# Patient Record
Sex: Male | Born: 1950 | Race: Black or African American | Hispanic: No | Marital: Single | Smoking: Never smoker
Health system: Southern US, Community
[De-identification: ages and names within clinical notes are randomized; demographics above are authoritative.]

## PROBLEM LIST (undated history)

## (undated) DIAGNOSIS — G510 Bell's palsy: Secondary | ICD-10-CM

## (undated) DIAGNOSIS — F8081 Childhood onset fluency disorder: Secondary | ICD-10-CM

## (undated) DIAGNOSIS — R634 Abnormal weight loss: Principal | ICD-10-CM

## (undated) DIAGNOSIS — E785 Hyperlipidemia, unspecified: Secondary | ICD-10-CM

## (undated) DIAGNOSIS — D573 Sickle-cell trait: Secondary | ICD-10-CM

## (undated) DIAGNOSIS — M1990 Unspecified osteoarthritis, unspecified site: Secondary | ICD-10-CM

## (undated) HISTORY — PX: POLYPECTOMY: SHX149

## (undated) HISTORY — PX: OTHER SURGICAL HISTORY: SHX169

## (undated) HISTORY — DX: Sickle-cell trait: D57.3

## (undated) HISTORY — DX: Unspecified osteoarthritis, unspecified site: M19.90

## (undated) HISTORY — DX: Bell's palsy: G51.0

## (undated) HISTORY — DX: Childhood onset fluency disorder: F80.81

## (undated) HISTORY — DX: Abnormal weight loss: R63.4

## (undated) HISTORY — DX: Hyperlipidemia, unspecified: E78.5

## (undated) HISTORY — PX: TONSILLECTOMY: SUR1361

---

## 2001-09-20 ENCOUNTER — Emergency Department (HOSPITAL_COMMUNITY): Admission: EM | Admit: 2001-09-20 | Discharge: 2001-09-20 | Payer: Self-pay | Admitting: Emergency Medicine

## 2001-09-22 ENCOUNTER — Encounter: Payer: Self-pay | Admitting: Emergency Medicine

## 2001-09-22 ENCOUNTER — Emergency Department (HOSPITAL_COMMUNITY): Admission: EM | Admit: 2001-09-22 | Discharge: 2001-09-22 | Payer: Self-pay | Admitting: Emergency Medicine

## 2001-10-06 ENCOUNTER — Emergency Department (HOSPITAL_COMMUNITY): Admission: EM | Admit: 2001-10-06 | Discharge: 2001-10-06 | Payer: Self-pay | Admitting: Emergency Medicine

## 2013-01-26 ENCOUNTER — Telehealth: Payer: Self-pay

## 2014-07-27 DIAGNOSIS — G510 Bell's palsy: Secondary | ICD-10-CM

## 2014-07-27 HISTORY — DX: Bell's palsy: G51.0

## 2014-08-14 ENCOUNTER — Ambulatory Visit (INDEPENDENT_AMBULATORY_CARE_PROVIDER_SITE_OTHER): Payer: No Typology Code available for payment source | Admitting: Family Medicine

## 2014-08-14 VITALS — BP 139/80 | HR 70 | Temp 97.6°F | Resp 18 | Ht 73.5 in | Wt 178.0 lb

## 2014-08-14 DIAGNOSIS — R29810 Facial weakness: Secondary | ICD-10-CM | POA: Diagnosis not present

## 2014-08-14 DIAGNOSIS — H02103 Unspecified ectropion of right eye, unspecified eyelid: Secondary | ICD-10-CM | POA: Diagnosis not present

## 2014-08-14 DIAGNOSIS — Z131 Encounter for screening for diabetes mellitus: Secondary | ICD-10-CM

## 2014-08-14 DIAGNOSIS — Z13220 Encounter for screening for lipoid disorders: Secondary | ICD-10-CM | POA: Diagnosis not present

## 2014-08-14 DIAGNOSIS — Z125 Encounter for screening for malignant neoplasm of prostate: Secondary | ICD-10-CM | POA: Diagnosis not present

## 2014-08-14 DIAGNOSIS — R21 Rash and other nonspecific skin eruption: Secondary | ICD-10-CM | POA: Diagnosis not present

## 2014-08-14 DIAGNOSIS — Z0000 Encounter for general adult medical examination without abnormal findings: Secondary | ICD-10-CM | POA: Diagnosis not present

## 2014-08-14 DIAGNOSIS — Z23 Encounter for immunization: Secondary | ICD-10-CM

## 2014-08-14 DIAGNOSIS — Z1211 Encounter for screening for malignant neoplasm of colon: Secondary | ICD-10-CM | POA: Diagnosis not present

## 2014-08-14 DIAGNOSIS — Z130 Encounter for screening for diseases of the blood and blood-forming organs and certain disorders involving the immune mechanism: Secondary | ICD-10-CM

## 2014-08-14 DIAGNOSIS — R011 Cardiac murmur, unspecified: Secondary | ICD-10-CM | POA: Diagnosis not present

## 2014-08-14 LAB — CBC
HCT: 43.7 % (ref 39.0–52.0)
Hemoglobin: 14.6 g/dL (ref 13.0–17.0)
MCH: 28.7 pg (ref 26.0–34.0)
MCHC: 33.4 g/dL (ref 30.0–36.0)
MCV: 85.9 fL (ref 78.0–100.0)
MPV: 10.8 fL (ref 8.6–12.4)
Platelets: 272 10*3/uL (ref 150–400)
RBC: 5.09 MIL/uL (ref 4.22–5.81)
RDW: 15.6 % — ABNORMAL HIGH (ref 11.5–15.5)
WBC: 4.1 10*3/uL (ref 4.0–10.5)

## 2014-08-14 LAB — COMPLETE METABOLIC PANEL WITH GFR
ALT: 14 U/L (ref 0–53)
AST: 19 U/L (ref 0–37)
Albumin: 4.3 g/dL (ref 3.5–5.2)
Alkaline Phosphatase: 63 U/L (ref 39–117)
BUN: 11 mg/dL (ref 6–23)
CO2: 26 mEq/L (ref 19–32)
Calcium: 9.6 mg/dL (ref 8.4–10.5)
Chloride: 103 mEq/L (ref 96–112)
Creat: 0.91 mg/dL (ref 0.50–1.35)
GFR, Est African American: >89 mL/min
GFR, Est Non African American: >89 mL/min
Glucose, Bld: 86 mg/dL (ref 70–99)
Potassium: 4.9 mEq/L (ref 3.5–5.3)
Sodium: 139 mEq/L (ref 135–145)
Total Bilirubin: 0.7 mg/dL (ref 0.2–1.2)
Total Protein: 7.1 g/dL (ref 6.0–8.3)

## 2014-08-14 LAB — LIPID PANEL
Cholesterol: 208 mg/dL — ABNORMAL HIGH (ref 0–200)
HDL: 71 mg/dL (ref >39)
LDL Cholesterol: 126 mg/dL — ABNORMAL HIGH (ref 0–99)
Total CHOL/HDL Ratio: 2.9 Ratio
Triglycerides: 53 mg/dL (ref <150)
VLDL: 11 mg/dL (ref 0–40)

## 2014-08-14 MED ORDER — ZOSTER VACCINE LIVE 19400 UNT/0.65ML ~~LOC~~ SOLR: 0.6500 mL | Freq: Once | SUBCUTANEOUS | Status: DC

## 2014-08-14 NOTE — Patient Instructions (Addendum)
For rash - hydrocortisone cream as needed, change form Dial to [employer], and if rash returns - return to have rechecked. Dry skin care: Drink at least 64 ounces of water daily. Consider a humidifier for the room where you sleep. Bathe once daily. Avoid using HOT water, as it dries skin.  Avoid deodorant soaps (Dial is the worst!) and stick with gentle cleansers (I like Cetaphil Liquid Cleanser). After bathing, dry off completely, then apply a thick emollient cream (I like Cetaphil Moisturizing Cream). Apply the cream twice daily, or more!  Shingles vaccine printout at your pharmacy.  We will refer you for colonoscopy.  You should receive a call or letter about your lab results within the next week to 10 days.  We will refer you to eye doctor and neurology for the weakness in the eye.  Can use over the counter refresh ointment at night to keep eye from getting dry for now.  We will also refer you to a heart doctor for evaluation of the heart murmur. Can start aspirin 81mg  once per day for now to lessen stroke or heart attack risk.  Return to the clinic or go to the nearest emergency room if any of your symptoms worsen or new symptoms occur.  Keeping you healthy  Get these tests  Blood pressure- Have your blood pressure checked once a year by your healthcare provider.  Normal blood pressure is 120/80  Weight- Have your body mass index (BMI) calculated to screen for obesity.  BMI is a measure of body fat based on height and weight. You can also calculate your own BMI at ViewBanking.[email or web address].  Cholesterol- Have your cholesterol checked every year.  Diabetes- Have your blood sugar checked regularly if you have high blood pressure, high cholesterol, have a family history of diabetes or if you are overweight.  Screening for Colon Cancer- Colonoscopy starting at age 30.  Screening may begin sooner depending on your family history and other health conditions. Follow up colonoscopy as directed  by your Gastroenterologist.  Screening for Prostate Cancer- Both blood work (PSA) and a rectal exam help screen for Prostate Cancer.  Screening begins at age 29 with African-American men and at age 54 with Caucasian men.  Screening may begin sooner depending on your family history.  Take these medicines  Aspirin- One aspirin daily can help prevent Heart disease and Stroke.  Flu shot- Every fall.  Tetanus- Every 10 years.  Zostavax- Once after the age of 69 to prevent Shingles.  Pneumonia shot- Once after the age of 80; if you are younger than 70, ask your healthcare provider if you need a Pneumonia shot.  Take these steps  Don't smoke- If you do smoke, talk to your doctor about quitting.  For tips on how to quit, go to www.smokefree.gov or call 1-800-QUIT-NOW.  Be physically active- Exercise 5 days a week for at least 30 minutes.  If you are not already physically active start slow and gradually work up to 30 minutes of moderate physical activity.  Examples of moderate activity include walking briskly, mowing the yard, dancing, swimming, bicycling, etc.  Eat a healthy diet- Eat a variety of healthy food such as fruits, vegetables, low fat milk, low fat cheese, yogurt, lean meant, poultry, fish, beans, tofu, etc. For more information go to www.thenutritionsource.org  Drink alcohol in moderation- Limit alcohol intake to less than two drinks a day. Never drink and drive.  Dentist- Brush and floss twice daily; visit your dentist twice a year.  Depression- Your emotional health is as important as your physical health. If you're feeling down, or losing interest in things you would normally enjoy please talk to your healthcare provider.  Eye exam- Visit your eye doctor every year.  Safe sex- If you may be exposed to a sexually transmitted infection, use a condom.  Seat belts- Seat belts can save your life; always wear one.  Smoke/Carbon Monoxide detectors- These detectors need to be  installed on the appropriate level of your home.  Replace batteries at least once a year.  Skin cancer- When out in the sun, cover up and use sunscreen 15 SPF or higher.  Violence- If anyone is threatening you, please tell your healthcare provider.  Living Will/ Health care power of attorney- Speak with your healthcare provider and family.

## 2014-08-14 NOTE — Progress Notes (Addendum)
Subjective:  This chart was scribe for Miguel Forehand, MD by Judithann Sauger, ED Scribe. The patient was seen in Room 12 and the patient's care was started at 11:07 AM.    Patient ID: Miguel Peters, male    DOB: [birth year], 64 y.o.   MRN: [id number]  HPI HPI Comments: Miguel Peters is a 64 y.o. male who presents to the [facility] for a complete physical. No previous [facility] visits in the last few years.  Cancer screening: He denies colonoscopy, No prior prostate cancer screening. He denies any cancer in the immediate family but reports that his aunt had cancer. Prostate cancer screening discussed and pt agrees to have one today. He denies having anything to eat PTA.  Immunizations: Tetanus: a couple of years ago, No Flu vaccine, No shingles vaccine.   Eye care provider: He denies any previous stroke or balls palsy. He also denies any weakness in his face. He reports that he has not been able to fully close his right eye for a couple of years. He denies seeing an Optometrist for this.   Dentist: No dentist. He reports that he does not have dental insurance but has an upcoming appointment with a dentist.  Exercise: He reports that he walks for exercise. He reports chronic pain in his knees and back. He denies any medical problems. He denies and falls in the past year or trouble walking. He denies being told that he has a heart murmur.   Fall and depression screening: Negative per screening tools and CH[facility].   He c/o an intermittent itchy rash on bilateral arms, bilateral legs, and waist onset a couple of months ago. 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 He reports using maximum strength hydrocortisone cream twice a day with improvement but the rash keeps coming back. He reports that he switched over to using Tide Free to wash his clothes after he had a rash and Dial soap to wash his body. He denies any  new lotions. He reports sick contacts at home. He denies any itching in between his fingers.   He lives by himself. He reports occasional knee pain but is able to climb the stairs. He is able to dress by himself. He reports that he is able to hear normally.   There are no active problems to display for this patient.  No past medical history on file. Past Surgical History  Procedure Laterality Date  . Hand surgery      thumb   No Known Allergies Prior to Admission medications   Not on File     Review of Systems  Skin: Positive for rash.   13 point ROS reviewed on pt health survey, negative other than discussed above.     Objective:   Physical Exam  Constitutional: He is oriented to person, place, and time. He appears well-developed and well-nourished. No distress.  HENT:  Head: Normocephalic and atraumatic.  Mouth/Throat: Oropharynx is clear and moist.  Moist mucosa in mouth  Eyes: Conjunctivae and EOM are normal.  Appears to have slight droop over left upper eye.   Neck: Neck supple. No tracheal deviation present.  Cardiovascular: Normal rate.   Murmur heard. 3/6 systolic murmur that does not appear to radiate to his coradins No corradin abruin   Pulmonary/Chest: Effort normal and breath sounds normal. No respiratory distress. He has no wheezes. He has no rales.  Musculoskeletal: Normal range of motion.  Neurological: He is alert and oriented to person, place, and time.  Decreased strength with closing right eye and unable to fully close lower lid on right eye with minimal movement.  Decreased LU facial movement.  Equal mouth movements  Equal strength in upper and lower extremities. No tremors.  No protonator drift, normal heel to toe.   Skin: Skin is warm and dry. No rash noted.  No visible rash at this time.   Psychiatric: He has a normal mood and affect. His behavior is normal.  Nursing note and vitals reviewed.  EKG is sinus rhythm. Rate: 67 No acute findings.    Filed Vitals:   08/14/14 1026  BP: 139/80  Pulse: 70  Temp: 97.6 F (36.4 C)  TempSrc: Oral  Resp: 18  Height: 6' 1.5" (1.867 m)  Weight: 178 lb (80.74 kg)  SpO2: 98%     Visual Acuity Screening   Right eye Left eye Both eyes  Without correction: 20/20 20/20 20/20   With correction:           Assessment & Plan:   Miguel Peters is a 64 y.o. male Annual physical exam  - -anticipatory guidance as below in AVS, screening labs above. Health maintenance items as above in HPI discussed/recommended as applicable.   Screening for diabetes mellitus - Plan: COMPLETE METABOLIC PANEL WITH GFR   Screening for hyperlipidemia - Plan: Lipid panel  Screening, anemia, deficiency, iron - Plan: CBC  Rash and nonspecific skin eruption  -dry skin vs contact derm with soap. Dry skin care as below, change to dove soap, and otc hydrocortisone if needed - rtc if rash returns.   Screen for colon cancer - Plan: Ambulatory referral to Gastroenterology  Screening for prostate cancer - Plan: PSA  -We discussed pros and cons of prostate cancer screening, and after this discussion, he chose to have screening done. PSA obtained, and no concerning findings on DRE.   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 Bells palsy with residual weakness of R lower lid, unable to completely approximate.  Will refer to neuro and optho, refresh ointment QHS to lessen corneal drying, start ASA 81mg  qd. Lipids to risk stratify.   Heart murmur - Plan: EKG 12-Lead, Ambulatory referral to Cardiology  Need for shingles vaccine - Plan: zoster vaccine live, PF, (ZOSTAVAX) 52841 UNT/0.65ML injection paper Rx given.    Meds ordered this encounter  Medications  . zoster vaccine live, PF, (ZOSTAVAX) 32440  UNT/0.65ML injection    Sig: Inject 19,400 Units into the skin once.    Dispense:  1 each    Refill:  0   Patient Instructions  For rash - hydrocortisone cream as needed, change form Dial to [employer], and if rash returns - return to have rechecked. Dry skin care: Drink at least 64 ounces of water daily. Consider a humidifier for the room where you sleep. Bathe once daily. Avoid using HOT water, as it dries skin.  Avoid deodorant soaps (Dial is the worst!) and stick with gentle cleansers (I like Cetaphil Liquid Cleanser). After bathing, dry off completely, then apply a thick emollient cream (I like Cetaphil Moisturizing Cream). Apply the cream twice daily, or more!  Shingles vaccine printout at your pharmacy.  We will refer you  for colonoscopy.  You should receive a call or letter about your lab results within the next week to 10 days.  We will refer you to eye doctor and neurology for the weakness in the eye.  Can use over the counter refresh ointment at night to keep eye from getting dry for now.  We will also refer you to a heart doctor for evaluation of the heart murmur. Can start aspirin 81mg  once per day for now to lessen stroke or heart attack risk.  Return to the clinic or go to the nearest emergency room if any of your symptoms worsen or new symptoms occur.  Keeping you healthy  Get these tests  Blood pressure- Have your blood pressure checked once a year by your healthcare provider.  Normal blood pressure is 120/80  Weight- Have your body mass index (BMI) calculated to screen for obesity.  BMI is a measure of body fat based on height and weight. You can also calculate your own BMI at ViewBanking.si.  Cholesterol- Have your cholesterol checked every year.  Diabetes- Have your blood sugar checked regularly if you have high blood pressure, high cholesterol, have a family history of diabetes or if you are overweight.  Screening for Colon Cancer- Colonoscopy starting  at age 44.  Screening may begin sooner depending on your family history and other health conditions. Follow up colonoscopy as directed by your Gastroenterologist.  Screening for Prostate Cancer- Both blood work (PSA) and a rectal exam help screen for Prostate Cancer.  Screening begins at age 81 with African-American men and at age 64 with Caucasian men.  Screening may begin sooner depending on your family history.  Take these medicines  Aspirin- One aspirin daily can help prevent Heart disease and Stroke.  Flu shot- Every fall.  Tetanus- Every 10 years.  Zostavax- Once after the age of 40 to prevent Shingles.  Pneumonia shot- Once after the age of 49; if you are younger than 30, ask your healthcare provider if you need a Pneumonia shot.  Take these steps  Don't smoke- If you do smoke, talk to your doctor about quitting.  For tips on how to quit, go to www.smokefree.gov or call 1-800-QUIT-NOW.  Be physically active- Exercise 5 days a week for at least 30 minutes.  If you are not already physically active start slow and gradually work up to 30 minutes of moderate physical activity.  Examples of moderate activity include walking briskly, mowing the yard, dancing, swimming, bicycling, etc.  Eat a healthy diet- Eat a variety of healthy food such as fruits, vegetables, low fat milk, low fat cheese, yogurt, lean meant, poultry, fish, beans, tofu, etc. For more information go to www.thenutritionsource.org  Drink alcohol in moderation- Limit alcohol intake to less than two drinks a day. Never drink and drive.  Dentist- Brush and floss twice daily; visit your dentist twice a year.  Depression- Your emotional health is as important as your physical health. If you're feeling down, or losing interest in things you would normally enjoy please talk to your healthcare provider.  Eye exam- Visit your eye doctor every year.  Safe sex- If you may be exposed to a sexually transmitted infection, use a  condom.  Seat belts- Seat belts can save your life; always wear one.  Smoke/Carbon Monoxide detectors- These detectors need to be installed on the appropriate level of your home.  Replace batteries at least once a year.  Skin cancer- When out in the sun, cover up and use  sunscreen 15 SPF or higher.  Violence- If anyone is threatening you, please tell your healthcare provider.  Living Will/ Health care power of attorney- Speak with your healthcare provider and family.    I personally performed the services described in this documentation, which was scribed in my presence. The recorded information has been reviewed and considered, and addended by me as needed.

## 2014-08-15 LAB — PSA: PSA: 1.53 ng/mL (ref <=4.00)

## 2014-08-21 ENCOUNTER — Encounter: Payer: Self-pay | Admitting: Internal Medicine

## 2014-08-22 ENCOUNTER — Encounter: Payer: Self-pay | Admitting: Family Medicine

## 2014-09-11 ENCOUNTER — Encounter: Payer: Self-pay | Admitting: Neurology

## 2014-09-11 ENCOUNTER — Ambulatory Visit (INDEPENDENT_AMBULATORY_CARE_PROVIDER_SITE_OTHER): Payer: No Typology Code available for payment source | Admitting: Neurology

## 2014-09-11 VITALS — BP 126/78 | HR 70 | Ht 73.5 in | Wt 179.0 lb

## 2014-09-11 DIAGNOSIS — G510 Bell's palsy: Secondary | ICD-10-CM

## 2014-09-11 MED ORDER — LUBRICANT EYE OP OINT: 1.0000 [drp] | TOPICAL_OINTMENT | OPHTHALMIC | Status: DC | PRN

## 2014-09-11 NOTE — Progress Notes (Signed)
PATIENT: Miguel Peters DOB: July 08, 1951  HISTORICAL  Miguel Peters is a 64 years old right-handed male, referred by his primary care physician Dr. Nyoka Cowden, accompanied by his cousin Buren Kos for evaluation of difficulty closing his right eye  He had 12 years of education, work as housekeeping, has started drinking speech,become unemployed 2 years ago, currently lives in a boarding house,he was noticed to have difficulty closing his right eye,in his yearly checkup in January 2016  He has not seen a physician for few years, he was not sure the onset time of his difficulty closing his right eye, over the past few years, he noticed he could not close his right eye while sleeping, watery of his right eye, but denied visual loss, no hearing loss, no significant change over the past few years,  He denies gait difficulty, he never drives in his life, was not able to afford a car  REVIEW OF SYSTEMS: Full 14 system review of systems performed and notable only for joint pain, slurred speech  ALLERGIES: No Known Allergies  HOME MEDICATIONS: Current Outpatient Prescriptions  Medication Sig Dispense Refill  . aspirin 81 MG tablet Take 81 mg by mouth daily.     No current facility-administered medications for this visit.    PAST MEDICAL HISTORY: Past Medical History  Diagnosis Date  . Arthritis     PAST SURGICAL HISTORY: Past Surgical History  Procedure Laterality Date  . Hand surgery      thumb  . Tonsillectomy      FAMILY HISTORY: Family History  Problem Relation Age of Onset  . Sickle cell anemia Mother   . Kidney disease Father     SOCIAL HISTORY:  History   Social History  . Marital Status: Single    Spouse Name: N/A  . Number of Children: 0  . Years of Education: 12   Occupational History  . [employer]   Social History Main Topics  . Smoking status: Never Smoker   . Smokeless tobacco: Not on file  . Alcohol  Use: 0.0 oz/week    0 Standard drinks or equivalent per week  . Drug Use: No  . Sexual Activity: Not on file   Other Topics Concern  . Not on file   Social History Narrative   Lives at a boarding house.   Right handed.   2-4 cups/caffeine daily     PHYSICAL EXAM   Filed Vitals:   09/11/14 0855  BP: 126/78  Pulse: 70  Height: 6' 1.5" (1.867 m)  Weight: 179 lb (81.194 kg)    Not recorded      Body mass index is 23.29 kg/(m^2).   Generalized: In no acute distress  Neck: Supple, no carotid bruits   Cardiac: Regular rate rhythm  Pulmonary: Clear to auscultation bilaterally  Musculoskeletal: No deformity  Neurological examination  Mentation: Alert oriented to time, place, history taking, and causual conversation  Cranial nerve II-XII: Pupils were equal round reactive to light. Extraocular movements were full.  Visual field were full on confrontational test. Bilateral fundi were sharp.  Facial sensation was normal. he has difficulty closing his right eye, was able to cover his right cornea only.bilateral corneal reflexes were present. Hearing was intact to finger rubbing bilaterally. Uvula tongue midline.  Head turning and shoulder shrug and were normal and symmetric.Tongue protrusion into cheek strength was normal.  Motor: Normal tone, bulk and strength.  Sensory: Intact to fine touch, pinprick, preserved vibratory sensation, and  proprioception at toes.  Coordination: Normal finger to nose, heel-to-shin bilaterally there was no truncal ataxia  Gait: Rising up from seated position without assistance, normal stance, without trunk ataxia, moderate stride, good arm swing, smooth turning, able to perform tiptoe, and heel walking without difficulty.   Romberg signs: Negative  Deep tendon reflexes: Brachioradialis 2/2, biceps 2/2, triceps 2/2, patellar 2/2, Achilles 2/2, plantar responses were flexor bilaterally.   DIAGNOSTIC DATA (LABS, IMAGING, TESTING) - I reviewed  patient records, labs, notes, testing and imaging myself where available.  Lab Results  Component Value Date   WBC 4.1 08/14/2014   HGB 14.6 08/14/2014   HCT 43.7 08/14/2014   MCV 85.9 08/14/2014   PLT 272 08/14/2014      Component Value Date/Time   NA 139 08/14/2014 1144   K 4.9 08/14/2014 1144   CL 103 08/14/2014 1144   CO2 26 08/14/2014 1144   GLUCOSE 86 08/14/2014 1144   BUN 11 08/14/2014 1144   CREATININE 0.91 08/14/2014 1144   CALCIUM 9.6 08/14/2014 1144   PROT 7.1 08/14/2014 1144   ALBUMIN 4.3 08/14/2014 1144   AST 19 08/14/2014 1144   ALT 14 08/14/2014 1144   ALKPHOS 63 08/14/2014 1144   BILITOT 0.7 08/14/2014 1144   GFRNONAA >89 08/14/2014 1144   GFRAA >89 08/14/2014 1144   Lab Results  Component Value Date   CHOL 208* 08/14/2014   HDL 71 08/14/2014   LDLCALC 126* 08/14/2014   TRIG 53 08/14/2014   CHOLHDL 2.9 08/14/2014   ASSESSMENT AND PLAN  Miguel Peters is a 64 y.o. 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 Return to clinic for new issues  Marcial Pacas, M.D. Ph.D.  [facility] [address], [place], [place] [address] Ph: [phone] Fax: [phone]

## 2014-09-11 NOTE — Patient Instructions (Signed)
Miguel Peters, M.D. Associate Professor, Ophthalmology [facility]) Clinical Interests Cataract Surgery, Comprehensive Ophthalmology, Diabetic Eye Disease, Eyelid Reconstruction, [place] Patient Appointments: [phone]  Returning Patient Appointments: [phone] Department: [phone]

## 2014-09-19 ENCOUNTER — Ambulatory Visit (AMBULATORY_SURGERY_CENTER): Payer: Self-pay | Admitting: *Deleted

## 2014-09-19 VITALS — Ht 75.0 in | Wt 185.0 lb

## 2014-09-19 DIAGNOSIS — Z1211 Encounter for screening for malignant neoplasm of colon: Secondary | ICD-10-CM

## 2014-09-19 MED ORDER — NA SULFATE-K SULFATE-MG SULF 17.5-3.13-1.6 GM/177ML PO SOLN: ORAL | Status: DC

## 2014-09-19 NOTE — Progress Notes (Signed)
Patient denies any allergies to eggs or soy. Patient denies any problems with anesthesia/sedation. Patient denies any oxygen use at home and does not take any diet/weight loss medications. No computer per pt.

## 2014-10-05 ENCOUNTER — Ambulatory Visit (AMBULATORY_SURGERY_CENTER): Payer: No Typology Code available for payment source | Admitting: Internal Medicine

## 2014-10-05 ENCOUNTER — Encounter: Payer: Self-pay | Admitting: Internal Medicine

## 2014-10-05 VITALS — BP 121/76 | HR 56 | Temp 96.2°F | Resp 16 | Ht 73.0 in | Wt 179.0 lb

## 2014-10-05 DIAGNOSIS — Z1211 Encounter for screening for malignant neoplasm of colon: Secondary | ICD-10-CM

## 2014-10-05 DIAGNOSIS — D124 Benign neoplasm of descending colon: Secondary | ICD-10-CM | POA: Diagnosis not present

## 2014-10-05 DIAGNOSIS — D122 Benign neoplasm of ascending colon: Secondary | ICD-10-CM

## 2014-10-05 HISTORY — PX: COLONOSCOPY: SHX174

## 2014-10-05 MED ORDER — SODIUM CHLORIDE 0.9 % IV SOLN: 500.0000 mL | INTRAVENOUS | Status: DC

## 2014-10-05 NOTE — Progress Notes (Signed)
Called to room to assist during endoscopic procedure.  Patient ID and intended procedure confirmed with present staff. Received instructions for my participation in the procedure from the performing physician.  

## 2014-10-05 NOTE — Progress Notes (Signed)
Ok to discharge patient in 20 minutes if stable per Dr. Olevia Perches.

## 2014-10-05 NOTE — Progress Notes (Signed)
To recovery. Report to Nyra Capes, Therapist, sports. VSS

## 2014-10-05 NOTE — Op Note (Signed)
[facility]  [address]. [place], [address]   COLONOSCOPY PROCEDURE REPORT  PATIENT: Miguel, Peters  MR#: [id number] BIRTHDATE: 23-May-1951 , 12  yrs. old GENDER: male ENDOSCOPIST: Lafayette Dragon, MD REFERRED SF:KCLEXNT Greene, M.D. PROCEDURE DATE:  10/05/2014 PROCEDURE:   Colonoscopy, screening, Colonoscopy with snare polypectomy, and Colonoscopy with cold biopsy polypectomy First Screening Colonoscopy - Avg.  risk and is 50 yrs.  old or older Yes.  Prior Negative Screening - Now for repeat screening. N/A  History of Adenoma - Now for follow-up colonoscopy & has been > or = to 3 yrs.  N/A ASA CLASS:   Class II INDICATIONS:Screening for colonic neoplasia and Colorectal Neoplasm Risk Assessment for this procedure is average risk. MEDICATIONS: Monitored anesthesia care and Propofol 300 mg IV  DESCRIPTION OF PROCEDURE:   After the risks benefits and alternatives of the procedure were thoroughly explained, informed consent was obtained.  The digital rectal exam revealed no abnormalities of the rectum.   The LB [id number]  endoscope was introduced through the anus and advanced to the cecum, which was identified by both the appendix and ileocecal valve. No adverse events experienced.   The quality of the prep was good.  (MoviPrep was used)  The instrument was then slowly withdrawn as the colon was fully examined.      COLON FINDINGS: Three sessile polyps measuring 11 mm, 8 mm and 3 mm in size were found in the descending colonx1 and ascending colon.x2 ( 3 mm and 11 mm)  A polypectomy was performed with a cold snare. of 11 mm and 8 mm polyps  The resection was complete, the polyp tissue was completely retrieved and sent to histology.  A polypectomy was performed with cold forceps.of 3 mm polyp  The resection was complete, the polyp tissue was completely retrieved and sent to histology.   There was mild diverticulosis noted in the sigmoid colon.   Small  internal Grade I hemorrhoids were found. Retroflexed views revealed no abnormalities. The time to cecum = 5.29 Withdrawal time = 10.55   The scope was withdrawn and the procedure completed. COMPLICATIONS: There were no immediate complications.  ENDOSCOPIC IMPRESSION: 1.   Three sessile polyps were found in the descendingx1  colon and ascending colon;x2  polypectomy was performed with a cold snare; polypectomy was performed with cold forceps 2.   Mild diverticulosis was noted in the sigmoid colon 3.   Small internal Grade I hemorrhoids  RECOMMENDATIONS: Await path report No aspirin or anti-inflammatory agents for 2 weeks High fiber diet Recall colonoscopy pending path report  eSigned:  Lafayette Dragon, MD 10/05/2014 9:23 AM   cc:   PATIENT NAME:  Miguel, Peters MR#: [id number]

## 2014-10-05 NOTE — Patient Instructions (Signed)
YOU HAD AN ENDOSCOPIC PROCEDURE TODAY AT [facility]:   Refer to the procedure report that was given to you for any specific questions about what was found during the examination.  If the procedure report does not answer your questions, please call your gastroenterologist to clarify.  If you requested that your care partner not be given the details of your procedure findings, then the procedure report has been included in a sealed envelope for you to review at your convenience later.  YOU SHOULD EXPECT: Some feelings of bloating in the abdomen. Passage of more gas than usual.  Walking can help get rid of the air that was put into your GI tract during the procedure and reduce the bloating. If you had a lower endoscopy (such as a colonoscopy or flexible sigmoidoscopy) you may notice spotting of blood in your stool or on the toilet paper. If you underwent a bowel prep for your procedure, you may not have a normal bowel movement for a few days.  Please Note:  You might notice some irritation and congestion in your nose or some drainage.  This is from the oxygen used during your procedure.  There is no need for concern and it should clear up in a day or so.  SYMPTOMS TO REPORT IMMEDIATELY:   Following lower endoscopy (colonoscopy or flexible sigmoidoscopy):  Excessive amounts of blood in the stool  Significant tenderness or worsening of abdominal pains  Swelling of the abdomen that is new, acute  Fever of 100F or higher   For urgent or emergent issues, a gastroenterologist can be reached at any hour by calling [phone].   DIET: Your first meal following the procedure should be a small meal and then it is ok to progress to your normal diet. Heavy or fried foods are harder to digest and may make you feel nauseous or bloated.  Likewise, meals heavy in dairy and vegetables can increase bloating.  Drink plenty of fluids but you should avoid alcoholic beverages for 24 hours. Increase  the fiber in your diet.  ACTIVITY:  You should plan to take it easy for the rest of today and you should NOT DRIVE or use heavy machinery until tomorrow (because of the sedation medicines used during the test).    FOLLOW UP: Our staff will call the number listed on your records the next business day following your procedure to check on you and address any questions or concerns that you may have regarding the information given to you following your procedure. If we do not reach you, we will leave a message.  However, if you are feeling well and you are not experiencing any problems, there is no need to return our call.  We will assume that you have returned to your regular daily activities without incident.  If any biopsies were taken you will be contacted by phone or by letter within the next 1-3 weeks.  Please call us at [phone] if you have not heard about the biopsies in 3 weeks.    SIGNATURES/CONFIDENTIALITY: You and/or your care partner have signed paperwork which will be entered into your electronic medical record.  These signatures attest to the fact that that the information above on your After Visit Summary has been reviewed and is understood.  Full responsibility of the confidentiality of this discharge information lies with you and/or your care-partner.  NO ASPIRIN NOR NSAIDS FOR TWO WEEKS PER DR. Olevia Perches.  Read all handouts given to you by  your recovery room nurse.

## 2014-10-08 ENCOUNTER — Telehealth: Payer: Self-pay | Admitting: *Deleted

## 2014-10-08 NOTE — Telephone Encounter (Signed)
Left message that we called for f/u 

## 2014-10-09 ENCOUNTER — Encounter: Payer: Self-pay | Admitting: Internal Medicine

## 2016-01-06 ENCOUNTER — Telehealth: Payer: Self-pay

## 2016-01-06 NOTE — Telephone Encounter (Signed)
Pt called saying Dr. Carlota Raspberry had gave him some cream before for a rash.  He has no insurance and cant afford a visit.  I told him that it was very unlikely for someone to call him in something without seeing him.  Please be advised the patient has a stutter.  [phone]

## 2016-01-07 NOTE — Telephone Encounter (Signed)
Pt advised to RTC for Rx.

## 2017-01-08 ENCOUNTER — Ambulatory Visit (INDEPENDENT_AMBULATORY_CARE_PROVIDER_SITE_OTHER): Payer: Self-pay | Admitting: Internal Medicine

## 2017-01-08 ENCOUNTER — Encounter: Payer: Self-pay | Admitting: Internal Medicine

## 2017-01-08 VITALS — BP 110/78 | HR 70 | Resp 16 | Ht 74.0 in | Wt 145.0 lb

## 2017-01-08 DIAGNOSIS — F8081 Childhood onset fluency disorder: Secondary | ICD-10-CM

## 2017-01-08 DIAGNOSIS — R634 Abnormal weight loss: Secondary | ICD-10-CM

## 2017-01-08 DIAGNOSIS — Z5941 Food insecurity: Secondary | ICD-10-CM

## 2017-01-08 DIAGNOSIS — Z594 Lack of adequate food and safe drinking water: Secondary | ICD-10-CM

## 2017-01-08 HISTORY — DX: Childhood onset fluency disorder: F80.81

## 2017-01-08 NOTE — Progress Notes (Signed)
Subjective:    Patient ID: Miguel Peters, male    DOB: 24-May-1951, 66 y.o.   MRN: [id number]  HPI   Here to establish.  1.  Losing weight:  Not eating regularly.  Has noted weight loss for 2 months.  Has problems moving bowels at times--but sounds like his stool is soft, he just does not take enough in to have regular bowel movements.   Depends on what he eats and his food access. No loss of appetite or abdominal pain.  No melena or hematochezia.  Did have a colonoscopy in 2016, which apparently showed adenomatous polyps, but was otherwise normal--Dr. Olevia Perches No SOB, chest pain, cough, no definite fatigue, but lies down a lot for naps.   Lives by himself in apartment.   Does not belong to a church.  Does know where there is a food pantry by where he lives on [address] He has lost 32 lbs in the last 2 years.   Runs out of Money now as his EBT was cut drastically.  Does have social security.   PMH: 1.  Notable for right lower eyelid ectropion repair, tarsal strip and  Tarsorrhaphy.   This performed in 2016 at [facility].    2.  Chronic stuttering:  States it started when moved to [place] when 66 years old.  He was told it was due to the change in environment.  Current Meds  Medication Sig  . Artificial Tear Ointment (LUBRICANT EYE) OINT Apply 1 drop to eye as needed.  Marland Kitchen aspirin 81 MG tablet Take 81 mg by mouth daily.    No Known Allergies   Past Medical History:  Diagnosis Date  . Arthritis   . Stutter 01/08/2017    Past Surgical History:  Procedure Laterality Date  . hand surgery     thumb  . TONSILLECTOMY      Family History  Problem Relation Age of Onset  . Sickle cell anemia Mother   . Kidney disease Father   . Alcohol abuse Father   . Colon cancer Neg Hx     Social History   Social History  . Marital status: Single    Spouse name: N/A  . Number of children: 0  . Years of education: 12   Occupational History  . [employer]    2016   Social History Main Topics  . Smoking status: Never Smoker  . Smokeless tobacco: Never Used  . Alcohol use 0.0 oz/week     Comment: occasional per pt.  . Drug use: No  . Sexual activity: Not on file   Other Topics Concern  . Not on file   Social History Narrative   Lives at a boarding house.   Patient having difficulty affording food--reportedly because he lives in boarding house and does not have utilitiy bills, his EBT dollars were cut to $13 to $15 per month.   Sister states he also does not have room really to store his food.         Review of Systems     Objective:   Physical Exam  Tall and thin HEENT: right eye with decreased blinking.  Temporal canthus of right eye closed tightly with lower lid showing small degree of ectropion.  PERRL, EOMI, TMs pearly gray, throat clear, MMM Neck: Supple, No adenopathy Chest:  CTA CV:  RRR with normal S1 and S2, No S3, S4 or murmur, No carotid bruits, Carotid, radial pulses normal and equal Abd:  S, NT, No HSM or mass, + BS,  rubbing abdomen throughout interview. LE:  No edema       Assessment & Plan:  1.  Housing issues:  Sister states cannot get EBT amount increased (currently at $15 per month) until pays and [employer]chand[employer], retail, which won't happen in a boarding house.  He also is not allowed to store much food in his room:  Referral to [facility] for Homeless prevention as his weight loss is significant with inability to pay for food with current living situation.  2.  Food Insecurity with weight loss.  Check TSH, CBC, CBC to be certain

## 2017-01-08 NOTE — Patient Instructions (Signed)
[place]. For low cost food.

## 2017-01-09 LAB — TSH: TSH: 3.36 u[IU]/mL (ref 0.450–4.500)

## 2017-01-09 LAB — CBC WITH DIFFERENTIAL/PLATELET
BASOS: 2 %
Basophils Absolute: 0.1 10*3/uL (ref 0.0–0.2)
EOS (ABSOLUTE): 0.3 10*3/uL (ref 0.0–0.4)
EOS: 9 %
HEMATOCRIT: 41.3 % (ref 37.5–51.0)
HEMOGLOBIN: 14.2 g/dL (ref 13.0–17.7)
Immature Grans (Abs): 0 10*3/uL (ref 0.0–0.1)
Immature Granulocytes: 0 %
LYMPHS ABS: 1.6 10*3/uL (ref 0.7–3.1)
Lymphs: 42 %
MCH: 29 pg (ref 26.6–33.0)
MCHC: 34.4 g/dL (ref 31.5–35.7)
MCV: 84 fL (ref 79–97)
MONOCYTES: 6 %
Monocytes Absolute: 0.2 10*3/uL (ref 0.1–0.9)
Neutrophils Absolute: 1.6 10*3/uL (ref 1.4–7.0)
Neutrophils: 41 %
Platelets: 216 10*3/uL (ref 150–379)
RBC: 4.9 x10E6/uL (ref 4.14–5.80)
RDW: 15.4 % (ref 12.3–15.4)
WBC: 3.9 10*3/uL (ref 3.4–10.8)

## 2017-01-09 LAB — COMPREHENSIVE METABOLIC PANEL
ALBUMIN: 4.5 g/dL (ref 3.6–4.8)
ALK PHOS: 54 IU/L (ref 39–117)
ALT: 16 IU/L (ref 0–44)
AST: 20 IU/L (ref 0–40)
Albumin/Globulin Ratio: 2 (ref 1.2–2.2)
BUN / CREAT RATIO: 18 (ref 10–24)
BUN: 19 mg/dL (ref 8–27)
Bilirubin Total: 0.6 mg/dL (ref 0.0–1.2)
CALCIUM: 9.8 mg/dL (ref 8.6–10.2)
CO2: 26 mmol/L (ref 20–29)
CREATININE: 1.04 mg/dL (ref 0.76–1.27)
Chloride: 97 mmol/L (ref 96–106)
GFR, EST AFRICAN AMERICAN: 87 mL/min/{1.73_m2} (ref >59)
GFR, EST NON AFRICAN AMERICAN: 76 mL/min/{1.73_m2} (ref >59)
GLUCOSE: 72 mg/dL (ref 65–99)
Globulin, Total: 2.2 g/dL (ref 1.5–4.5)
Potassium: 4.6 mmol/L (ref 3.5–5.2)
Sodium: 139 mmol/L (ref 134–144)
TOTAL PROTEIN: 6.7 g/dL (ref 6.0–8.5)

## 2017-01-11 ENCOUNTER — Encounter: Payer: Self-pay | Admitting: Internal Medicine

## 2017-02-12 ENCOUNTER — Encounter: Payer: Self-pay | Admitting: Internal Medicine

## 2017-02-12 ENCOUNTER — Ambulatory Visit (INDEPENDENT_AMBULATORY_CARE_PROVIDER_SITE_OTHER): Payer: Self-pay | Admitting: Internal Medicine

## 2017-02-12 VITALS — BP 122/70 | HR 74 | Resp 12 | Ht 74.0 in | Wt 142.0 lb

## 2017-02-12 DIAGNOSIS — Z9189 Other specified personal risk factors, not elsewhere classified: Secondary | ICD-10-CM

## 2017-02-12 DIAGNOSIS — R634 Abnormal weight loss: Secondary | ICD-10-CM

## 2017-02-12 HISTORY — DX: Abnormal weight loss: R63.4

## 2017-02-12 NOTE — Progress Notes (Signed)
   Subjective:    Patient ID: Miguel Peters, male    DOB: 10-07-1950, 66 y.o.   MRN: [id number]  HPI   1.  Weight loss due to poor food access as storage as living in a rooming house.  Sister brought him today.   Not clear if he screens his his telephone calls.  He states, however, he has not heard from [facility] regarding looking for a better home where he might have food storage.   He is in need of food today. He is only eating 2 meals daily and would prefer eating 3.  Generally, does not have breakfast. Has had spaghetti out of a can today.  Discussed his labs were all okay from June, though cholesterol mildly elevated.  Current Meds  Medication Sig  . Artificial Tear Ointment (LUBRICANT EYE) OINT Apply 1 drop to eye as needed.  Marland Kitchen aspirin 81 MG tablet Take 81 mg by mouth daily.    No Known Allergies     Review of Systems     Objective:   Physical Exam  NAD Lungs:  CTA CV:  RRR without murmur or rub, radial and DP pulses normal and equal Abd:  S, NT, No HSM or mass, + BS LE:  No edema.        Assessment & Plan:  1.  Weight loss, presumably from lack of food access. SHIIP  Spoke with Rev. Coates from [facility] on [address] Saturday.  Spoke with patient subsequently, who agreed it was okay to given her his address and phone to contact for support.  2.  Need for dental care:  Dental referral. Discussed we dropped off his referral to [facility] on Friday of last week.Marland Kitchen

## 2017-03-19 ENCOUNTER — Ambulatory Visit (INDEPENDENT_AMBULATORY_CARE_PROVIDER_SITE_OTHER): Payer: Self-pay | Admitting: Internal Medicine

## 2017-03-19 ENCOUNTER — Encounter: Payer: Self-pay | Admitting: Internal Medicine

## 2017-03-19 VITALS — BP 122/80 | HR 72 | Resp 12 | Ht 74.0 in | Wt 154.0 lb

## 2017-03-19 DIAGNOSIS — R634 Abnormal weight loss: Secondary | ICD-10-CM

## 2017-03-19 NOTE — Progress Notes (Signed)
   Subjective:    Patient ID: Miguel Peters, male    DOB: 1951/02/12, 66 y.o.   MRN: [id number]  HPI   Through HUD, he has obtained an apartment in [employer] apts on [address], which is apparently senior housing.  He will have his own apartment with kitchen.  Will move in 1.5 weeks.   He was contacted by [facility], not clear if they were the group helping him with HUD, likely not based on his description. He also received food from [facility]--very difficult to get a history if this is continuing.   Sounds like he was to return to the church if he needed more food and he has not done that.   Patient has gained 12 lbs since last visit due to access to more food.  Current Meds  Medication Sig  . Artificial Tear Ointment (LUBRICANT EYE) OINT Apply 1 drop to eye as needed.  Marland Kitchen aspirin 81 MG tablet Take 81 mg by mouth daily.   No Known Allergies Review of Systems     Objective:   Physical Exam  NAD LUngs:  CTA CV:   RRR without murmur or rub, radial and DP pulses normal and equal LE:  No edema         Assessment & Plan:  Good weight gain with improved food access. Will have him return for a full CPE.   He will get Medicare end of January and will need to find a new medical home as he does not live in the area.

## 2017-06-02 NOTE — Telephone Encounter (Signed)
done

## 2017-06-22 ENCOUNTER — Ambulatory Visit: Payer: Self-pay | Admitting: Internal Medicine

## 2017-06-22 ENCOUNTER — Encounter: Payer: Self-pay | Admitting: Internal Medicine

## 2017-06-22 VITALS — BP 130/88 | HR 78 | Resp 12 | Ht 74.0 in | Wt 161.0 lb

## 2017-06-22 DIAGNOSIS — Z23 Encounter for immunization: Secondary | ICD-10-CM

## 2017-06-22 DIAGNOSIS — G510 Bell's palsy: Secondary | ICD-10-CM

## 2017-06-22 DIAGNOSIS — R634 Abnormal weight loss: Secondary | ICD-10-CM

## 2017-06-22 DIAGNOSIS — F8081 Childhood onset fluency disorder: Secondary | ICD-10-CM

## 2017-06-22 DIAGNOSIS — Z0000 Encounter for general adult medical examination without abnormal findings: Secondary | ICD-10-CM

## 2017-06-22 DIAGNOSIS — Z125 Encounter for screening for malignant neoplasm of prostate: Secondary | ICD-10-CM

## 2017-06-22 DIAGNOSIS — E7800 Pure hypercholesterolemia, unspecified: Secondary | ICD-10-CM

## 2017-06-22 NOTE — Progress Notes (Signed)
Subjective:    Patient ID: Miguel Peters, male    DOB: 19-May-1951, 66 y.o.   MRN: [id number]  HPI   Here for CPE  Has a couple of concerns--jammed and cut his right little finger and also a rash on his back on and off for past 2 years.    Here for Male CPE:  1.  STE:  Does perform.  No findings.  No family history of testicular cancer.  2.  PSA/DRE: Last PSA was 08/14/2014 with normal result of 1.53.  He believes he has had DRE, which has been normal in the past.  No family history of prostate cancer.  4.  Colonoscopy: Only time performed 10/05/3014 with Dr. Delfin Edis.  Had 2 tubular adenomas without dysplasia.  She recommended repeat in 2021.    5.  Cholesterol/Glucose:  History of mildly elevated total cholesterol with very high HDL. Lipid Panel     Component Value Date/Time   CHOL 208 (H) 08/14/2014 1144   TRIG 53 08/14/2014 1144   HDL 71 08/14/2014 1144   CHOLHDL 2.9 08/14/2014 1144   VLDL 11 08/14/2014 1144   LDLCALC 126 (H) 08/14/2014 1144   Blood glucose has been normal in 2016 and 2018.  6.  Immunizations: Refuses influenza.  Cannot remember last Td.    7.  Osteoporosis:  Does not eat or drink or eat much in way of diary except for cheese.  He would be willing to drink 2 % Lactaid or almond milk o3 times daily.  Drinks 2 L of Coke weekly.  Caffeine free.     Review of Systems  Constitutional: Negative for appetite change and fatigue.  HENT: Positive for dental problem (His dental appointment keeps getting cancelled.  Has been once this year.  States his orange card is out of date. ). Negative for ear pain, hearing loss, sinus pressure, sneezing, sore throat and trouble swallowing.   Eyes:       States sees okay without correction.  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  Respiratory: Negative for cough and shortness of breath.   Cardiovascular: Negative for chest pain, palpitations and leg swelling.  Gastrointestinal: Positive for constipation  (sometimes--improves diet and this resolved). Negative for abdominal pain, blood in stool (no melena), diarrhea and nausea.  Genitourinary: Negative for decreased urine volume, dysuria, frequency, genital sores and testicular pain.  Musculoskeletal: Positive for arthralgias (Has known arthritis).  Skin: Positive for rash (on and off on back for past 2 years.).  Neurological: Negative for dizziness, weakness, numbness and headaches.  Hematological: Negative for adenopathy. Does not bruise/bleed easily.  Psychiatric/Behavioral: Negative for dysphoric mood. The patient is not nervous/anxious.        Objective:   Physical Exam  Constitutional: He is oriented to person, place, and time. He appears well-developed and well-nourished.  HENT:  Head: Normocephalic and atraumatic.  Right Ear: Hearing and external ear normal.  Left Ear: Hearing and external ear normal.  Nose: Nose normal.  Mouth/Throat: Uvula is midline, oropharynx is clear and moist and mucous membranes are normal. Normal dentition (many missing teeth.  some cavities).  Has cerumen obscuring most of TM bilaterally.  Eyes: Conjunctivae and EOM are normal. Pupils are equal, round, and reactive to light.  Discs sharp bilaterally Unable to fully close right eye.  Neck: Normal range of motion and full passive range of motion without pain. Neck supple. No thyroid mass and no thyromegaly present.  Cardiovascular: Normal rate, regular rhythm, S1 normal,  S2 normal and normal heart sounds. Exam reveals no S3, no S4 and no friction rub.  No murmur heard. No carotid bruits.  Carotid, radial, femoral, DP and PT pulses normal and equal.   Pulmonary/Chest: Effort normal and breath sounds normal.  Abdominal: Soft. Bowel sounds are normal. He exhibits no mass. There is no hepatosplenomegaly. There is no tenderness. No hernia. Hernia confirmed negative in the right inguinal area and confirmed negative in the left inguinal area.  Genitourinary:  Rectum normal and prostate normal. Rectal exam shows guaiac negative stool. Right testis shows no mass, no swelling and no tenderness. Right testis is descended. Left testis shows no mass, no swelling and no tenderness. Left testis is descended. Uncircumcised.  Musculoskeletal: Normal range of motion.  Right 5th finger with base of nail involved with subungual hematoma and mild swelling at base of nail.   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  Lymphadenopathy:       Head (right side): No submental and no submandibular adenopathy present.       Head (left side): No submental and no submandibular adenopathy present.    He has no cervical adenopathy.    He has no axillary adenopathy.       Right: No inguinal and no supraclavicular adenopathy present.       Left: No inguinal and no supraclavicular adenopathy present.  Neurological: He is alert and oriented to person, place, and time. He has normal strength and normal reflexes. He displays normal reflexes. A cranial nerve deficit (right upper face with weakness, inability to completely close eyelids.  Lower face with normal strength.) is present. No sensory deficit. He exhibits normal muscle tone. Coordination and gait normal.  Skin: Skin is warm and dry. No rash (Does have scattered hyperpigmentation over lumbosacral back and  around waistiline, but no current rash.) noted.  Psychiatric: His behavior is normal. Judgment and thought content normal. Cognition and memory are normal.  Speech with stutter.  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          Assessment & Plan:  1.  CPE CBC, CMP earlier this year normal PSA and FLP with history of elevated cholesterol with high HDL in 2016 Normal ECG in 2016 and no concerning cardiac complaints. Weight now  improving now he has improved access to food and food storage. Stool cards to be returned in 2 weeks. Refuses Influenza vaccine despite counseling.  Unable to clarify why he does not want. Tdap today  2.  History of adenomatous colon polyps:  Colonoscopy repeat in 2021.  3.  Dental decay:  Needs to renew orange card so can get dental care before he obtains Medicare in Feb.  2019.  Will see if he can get back here to get that done.

## 2017-06-22 NOTE — Patient Instructions (Signed)
Can google "advance directives, [place]"  And bring up form from Secretary of State. Print and fill out Or can go to "5 wishes"  Which is also in Spanish and fill out--this costs $5--perhaps easier to use. Designate a Medical Power of Attorney to speak for you if you are unable to speak for yourself when ill or injured  

## 2017-06-23 LAB — LIPID PANEL W/O CHOL/HDL RATIO
CHOLESTEROL TOTAL: 269 mg/dL — AB (ref 100–199)
HDL: 75 mg/dL (ref >39)
LDL CALC: 180 mg/dL — AB (ref 0–99)
Triglycerides: 71 mg/dL (ref 0–149)
VLDL CHOLESTEROL CAL: 14 mg/dL (ref 5–40)

## 2017-06-23 LAB — PSA: Prostate Specific Ag, Serum: 0.6 ng/mL (ref 0.0–4.0)

## 2017-07-08 ENCOUNTER — Telehealth: Payer: Self-pay | Admitting: Internal Medicine

## 2017-07-08 NOTE — Telephone Encounter (Signed)
Patient stated rash is still on back wants an Rx to see if rash will clear up.

## 2017-07-09 NOTE — Telephone Encounter (Signed)
To Dr. Mulberry for further direction 

## 2017-07-12 NOTE — Telephone Encounter (Signed)
He needs to be seen

## 2017-07-13 ENCOUNTER — Other Ambulatory Visit (INDEPENDENT_AMBULATORY_CARE_PROVIDER_SITE_OTHER): Payer: Self-pay

## 2017-07-13 DIAGNOSIS — Z1211 Encounter for screening for malignant neoplasm of colon: Secondary | ICD-10-CM

## 2017-07-13 LAB — POC HEMOCCULT BLD/STL (HOME/3-CARD/SCREEN)
Card #3 Fecal Occult Blood, POC: NEGATIVE
FECAL OCCULT BLD: NEGATIVE
Fecal Occult Blood, POC: NEGATIVE

## 2017-07-13 NOTE — Telephone Encounter (Signed)
Please schedule acute appointment for patient

## 2017-08-27 ENCOUNTER — Ambulatory Visit: Payer: No Typology Code available for payment source | Admitting: Nurse Practitioner

## 2017-08-30 ENCOUNTER — Ambulatory Visit (INDEPENDENT_AMBULATORY_CARE_PROVIDER_SITE_OTHER): Payer: Medicare HMO | Admitting: Family Medicine

## 2017-08-30 ENCOUNTER — Encounter: Payer: Self-pay | Admitting: Family Medicine

## 2017-08-30 VITALS — BP 118/80 | HR 61 | Temp 98.1°F | Resp 12 | Ht 74.0 in | Wt 184.1 lb

## 2017-08-30 DIAGNOSIS — G8929 Other chronic pain: Secondary | ICD-10-CM | POA: Diagnosis not present

## 2017-08-30 DIAGNOSIS — Z23 Encounter for immunization: Secondary | ICD-10-CM

## 2017-08-30 DIAGNOSIS — L810 Postinflammatory hyperpigmentation: Secondary | ICD-10-CM | POA: Diagnosis not present

## 2017-08-30 DIAGNOSIS — M545 Low back pain, unspecified: Secondary | ICD-10-CM

## 2017-08-30 DIAGNOSIS — E785 Hyperlipidemia, unspecified: Secondary | ICD-10-CM

## 2017-08-30 NOTE — Patient Instructions (Addendum)
A few things to remember from today's visit:   Post-inflammatory hyperpigmentation  Hyperlipidemia, unspecified hyperlipidemia type  Chronic low back pain without sciatica, unspecified back pain laterality  Fall precautions. Time for pigmented areas to resolve.  We will check cholesterol in 12/2017.  Icy hot path,stretching exercsies,and Tylenol 500 mg max 3 times per day may help.    Back pain is very common in adults.The cause of back pain is rarely dangerous and the pain often gets better over time even with no pharmacologic treatment.  The cause of your back pain may not be known. Some common causes of back pain include: 1. Strain of the muscles or ligaments supporting the spine. 2. Wear and tear (degeneration) of the spinal disks. 3. Arthritis. 4. Direct injury to the back.  For many people, back pain may return. Since back pain is rarely dangerous, most people can learn to manage this condition on their own.  HOME CARE INSTRUCTIONS Watch your back pain for any changes. The following actions may help to lessen any discomfort you are feeling:  1. Remain active. It is stressful on your back to sit or stand in one place for long periods of time. Do not sit, drive, or stand in one place for more than 30 minutes at a time. Take short walks on even surfaces as soon as you are able.Try to increase the length of time you walk each day.  2. Exercise regularly as directed by your health care provider. Exercise helps your back heal faster. It also helps avoid future injury by keeping your muscles strong and flexible.  3. Do not stay in bed.Resting more than 1-2 days can delay your recovery.                                                      4. Pay attention to your body when you bend and lift. The most comfortable positions are those that put less stress on your recovering back.  5.  Always use proper lifting techniques, including: Bending your knees. Keeping the load close  to your body. Avoiding twisting.  6. Find a comfortable position to sleep. Use a firm mattress and lie on your side with your knees slightly bent. If you lie on your back, put a pillow under your knees.  7. Over the counter rubbing medications like Icy Hot or Asper cream with Lidocaine may help without significant side effects.  Acetaminophen and/or Aleve/Ibuprofen can be taken if needed and if not contraindications. Local ice and heat may be alternated to reduce pain and spasms. Also massage and even chiropractor treatment.      Muscle relaxants might or might not help, they cause drowsiness among other    side effects. They could also interact with some of medications you may be already taking (medications for depression/anxiety and some pain medications).   8. Maintain a healthy weight. Excess weight puts extra stress on your back and makes it difficult to maintain good posture.   SEEK MEDICAL CARE IF: worsening pain, associated fever, rash/edema on area, pain going to legs or buttocks, numbness/tingling, night pain, or abnormal weight loss.    SEEK IMMEDIATE MEDICAL CARE IF:  1. You develop new bowel or bladder control problems. 2. You have unusual weakness or numbness in your arms or legs. 3. You develop nausea or  vomiting. 4. You develop abdominal pain. 5. You feel faint.     Back Exercises The following exercises strengthen the muscles that help to support the back. They also help to keep the lower back flexible. Doing these exercises can help to prevent back pain or lessen existing pain. If you have back pain or discomfort, try doing these exercises 2-3 times each day or as told by your health care provider. When the pain goes away, do them once each day, but increase the number of times that you repeat the steps for each exercise (do more repetitions). If you do not have back pain or discomfort, do these exercises once each day or as told by your health care  provider.   EXERCISES Single Knee to Chest Repeat these steps 3-5 times for each leg: 5. Lie on your back on a firm bed or the floor with your legs extended. 6. Bring one knee to your chest. Your other leg should stay extended and in contact with the floor. 7. Hold your knee in place by grabbing your knee or thigh. 8. Pull on your knee until you feel a gentle stretch in your lower back. 9. Hold the stretch for 10-30 seconds. 10. Slowly release and straighten your leg.  Pelvic Tilt Repeat these steps 5-10 times: 2. Lie on your back on a firm bed or the floor with your legs extended. 3. Bend your knees so they are pointing toward the ceiling and your feet are flat on the floor. 4. Tighten your lower abdominal muscles to press your lower back against the floor. This motion will tilt your pelvis so your tailbone points up toward the ceiling instead of pointing to your feet or the floor. 5. With gentle tension and even breathing, hold this position for 5-10 seconds.  Cat-Cow Repeat these steps until your lower back becomes more flexible: 1. Get into a hands-and-knees position on a firm surface. Keep your hands under your shoulders, and keep your knees under your hips. You may place padding under your knees for comfort. 2. Let your head hang down, and point your tailbone toward the floor so your lower back becomes rounded like the back of a cat. 3. Hold this position for 5 seconds. 4. Slowly lift your head and point your tailbone up toward the ceiling so your back forms a sagging arch like the back of a cow. 5. Hold this position for 5 seconds.   Press-Ups Repeat these steps 5-10 times: 6. Lie on your abdomen (face-down) on the floor. 7. Place your palms near your head, about shoulder-width apart. 8. While you keep your back as relaxed as possible and keep your hips on the floor, slowly straighten your arms to raise the top half of your body and lift your shoulders. Do not use your back  muscles to raise your upper torso. You may adjust the placement of your hands to make yourself more comfortable. 9. Hold this position for 5 seconds while you keep your back relaxed. 10. Slowly return to lying flat on the floor.   Bridges Repeat these steps 10 times: 1. Lie on your back on a firm surface. 2. Bend your knees so they are pointing toward the ceiling and your feet are flat on the floor. 3. Tighten your buttocks muscles and lift your buttocks off of the floor until your waist is at almost the same height as your knees. You should feel the muscles working in your buttocks and the back of your thighs.  If you do not feel these muscles, slide your feet 1-2 inches farther away from your buttocks. 4. Hold this position for 3-5 seconds. 5. Slowly lower your hips to the starting position, and allow your buttocks muscles to relax completely. If this exercise is too easy, try doing it with your arms crossed over your chest.     Please be sure medication list is accurate. If a new problem present, please set up appointment sooner than planned today.

## 2017-08-30 NOTE — Progress Notes (Signed)
HPI:   Miguel Peters is a 67 y.o. male, who is here today to establish care with me.  Former PCP: Dr Amil Amen Last preventive routine visit: 05/2016  Chronic medical problems: HLD,right Bell's palsy with residual weakness in 2016.  He does not drive.   Concerns today:   Rash on lower back that he has had intermittently for 2 years. Topical mediation OTC helps temporarily,he doe snot recall name.  Occasionally it turns red. He thinks it is related to new detergent. Rash resolved but "dark spots" are still present on area.  He denies tenderness,numbness,or tingling.    Hyperlipidemia:  Currently on non pharmacologic treatment. Following a low fat diet: Yes.    Lab Results  Component Value Date   CHOL 269 (H) 06/22/2017   HDL 75 06/22/2017   LDLCALC 180 (H) 06/22/2017   TRIG 71 06/22/2017   CHOLHDL 2.9 08/14/2014     He is also c/o lower back pain for "some time", left lower back mainly. Exacerbated by exercise, when he does set ups. Pain is not radiated,no limitation of his daily activities. He denies saddle anesthesia,changes in urine/bowel continence,or LE tingling/numbness.  He has not tried OTC medication.  Review of Systems  Constitutional: Negative for activity change, appetite change, fatigue and fever.  HENT: Negative for nosebleeds, sore throat and trouble swallowing.   Eyes: Negative for redness and visual disturbance.  Respiratory: Negative for cough, shortness of breath and wheezing.   Cardiovascular: Negative for chest pain, palpitations and leg swelling.  Gastrointestinal: Negative for abdominal pain, nausea and vomiting.  Endocrine: Negative for cold intolerance, heat intolerance, polydipsia, polyphagia and polyuria.  Genitourinary: Negative for decreased urine volume, dysuria and hematuria.  Musculoskeletal: Positive for back pain and gait problem.  Skin: Positive for rash. Negative for wound.  Neurological: Negative for  syncope, weakness and headaches.  Psychiatric/Behavioral: Negative for confusion. The patient is nervous/anxious.       Current Outpatient Medications on File Prior to Visit  Medication Sig Dispense Refill  . aspirin 81 MG tablet Take 81 mg by mouth daily.     No current facility-administered medications on file prior to visit.      Past Medical History:  Diagnosis Date  . Arthritis   . Bell's palsy 2016   Right sided.  Not clear when acutely suffered this.  Was noted on exam after years of no medical attention 07/2014.  Evaluated by [facility]  subsequently.  Marland Kitchen Hyperlipidemia   . Stutter age 34   cannot recall if something happened in life that set this off.  . Weight loss 02/12/2017   No Known Allergies  Family History  Problem Relation Age of Onset  . Sickle cell anemia Mother   . Kidney disease Father   . Alcohol abuse Father   . Colon cancer Neg Hx     Social History   Socioeconomic History  . Marital status: Single    Spouse name: None  . Number of children: 0  . Years of education: 95  . Highest education level: 12th grade  Social Needs  . Financial resource strain: Hard  . Food insecurity - worry: Sometimes true  . Food insecurity - inability: Sometimes true  . Transportation needs - medical: No  . Transportation needs - non-medical: No  Occupational History  . Occupation: Maintenance/Housekeeping    Employer: [employer]    Comment: 2016  Tobacco Use  . Smoking status: Never Smoker  .  Smokeless tobacco: Never Used  Substance and Sexual Activity  . Alcohol use: Yes    Alcohol/week: 0.0 oz    Comment: Once a monthly  . Drug use: No  . Sexual activity: Yes    Partners: Female  Other Topics Concern  . None  Social History Narrative   Was living at a boarding house.   Patient was having difficulty affording food--reportedly because he lived in boarding house and did not have utilitiy bills, his EBT dollars were cut to $13 to  $15 per month.   That has been changed.  He now receives adequate EBT dollars.      Now living at [facility].  This is for folks with a disability.  He has an apartment now with a kitchen.  The [place] near him set him up in these apartments that they support.     Grant Ruts [phone]    Vitals:   08/30/17 0849  BP: 118/80  Pulse: 61  Resp: 12  Temp: 98.1 F (36.7 C)  SpO2: 98%    Body mass index is 23.64 kg/m.    Physical Exam  Nursing note and vitals reviewed. Constitutional: He is oriented to person, place, and time. He appears well-developed and well-nourished. No distress.  HENT:  Head: Normocephalic and atraumatic.  Mouth/Throat: Oropharynx is clear and moist and mucous membranes are normal.  Eyes: Conjunctivae are normal. Pupils are equal, round, and reactive to light.  Cardiovascular: Normal rate and regular rhythm.  No murmur heard. Pulses:      Dorsalis pedis pulses are 2+ on the right side, and 2+ on the left side.  Respiratory: Effort normal and breath sounds normal. No respiratory distress.  GI: Soft. He exhibits no mass. There is no hepatomegaly. There is no tenderness.  Musculoskeletal: He exhibits no edema.       Lumbar back: He exhibits no tenderness and no bony tenderness.  Lymphadenopathy:    He has no cervical adenopathy.  Neurological: He is alert and oriented to person, place, and time. He has normal strength.  Right facial weakness. Mildly unstable gait,not assisted.  Skin: Skin is warm. No rash noted. No erythema.  Hyperpigmented macular confluent lesions on mid sacral area. No active rash appreciated,no edema and no tenderness.  Psychiatric: He has a normal mood and affect. Cognition and memory are normal.  Stuttering a few times. Well groomed, good eye contact.     ASSESSMENT AND PLAN:   [name] was seen today for establish care.  Diagnoses and all orders for this visit:  Hyperlipidemia, unspecified hyperlipidemia  type  For now continue low fat diet. Will repeat lab in 12/2017.  Post-inflammatory hyperpigmentation  Reassured. Educated about problem and explained that it may persist for months or years if rash does not re-occur and no sun exposure. F/U as needed.  Chronic low back pain without sciatica, unspecified back pain laterality  Recommend avoiding trigger factors. Topical Icy hot or similar OTC product may help. Instructed about warning signs.  Need for vaccination against Streptococcus pneumoniae using pneumococcal conjugate vaccine 13 -     Pneumococcal conjugate vaccine 13-valent IM      Betty G. Martinique, MD  [facility]. [place] office.

## 2017-08-30 NOTE — Assessment & Plan Note (Signed)
Recommend avoiding activities that cause pain. If needed Tylenol 500 mg tid. Icy hot patch may also help as well as stretching exercises. Fall prevention.

## 2017-08-30 NOTE — Assessment & Plan Note (Signed)
Continue low fat diet. We will re-check in 12/2017.

## 2017-09-03 ENCOUNTER — Telehealth: Payer: Self-pay | Admitting: Family Medicine

## 2017-09-03 NOTE — Telephone Encounter (Signed)
Form giving to Dr. Martinique, will call patient when form is completed.

## 2017-09-03 NOTE — Telephone Encounter (Signed)
SCAT application to be filled out- placed in dr's folder.  Call [phone] upon completion.

## 2017-09-10 DIAGNOSIS — Z0279 Encounter for issue of other medical certificate: Secondary | ICD-10-CM

## 2017-09-13 NOTE — Telephone Encounter (Signed)
Patient picked up form and paid $29 form fee  Copy sent to scan

## 2017-11-08 ENCOUNTER — Other Ambulatory Visit: Payer: Self-pay

## 2017-12-28 ENCOUNTER — Other Ambulatory Visit: Payer: Self-pay | Admitting: *Deleted

## 2017-12-28 ENCOUNTER — Encounter: Payer: Self-pay | Admitting: Family Medicine

## 2017-12-28 ENCOUNTER — Ambulatory Visit (INDEPENDENT_AMBULATORY_CARE_PROVIDER_SITE_OTHER): Payer: Medicare Other | Admitting: Family Medicine

## 2017-12-28 VITALS — BP 122/80 | HR 80 | Temp 97.6°F | Resp 16 | Ht 74.0 in | Wt 185.5 lb

## 2017-12-28 DIAGNOSIS — H6121 Impacted cerumen, right ear: Secondary | ICD-10-CM | POA: Diagnosis not present

## 2017-12-28 DIAGNOSIS — L298 Other pruritus: Secondary | ICD-10-CM

## 2017-12-28 DIAGNOSIS — L2989 Other pruritus: Secondary | ICD-10-CM

## 2017-12-28 DIAGNOSIS — E785 Hyperlipidemia, unspecified: Secondary | ICD-10-CM | POA: Diagnosis not present

## 2017-12-28 LAB — LIPID PANEL
CHOL/HDL RATIO: 4
Cholesterol: 226 mg/dL — ABNORMAL HIGH (ref 0–200)
HDL: 54.1 mg/dL (ref >39.00)
LDL CALC: 163 mg/dL — AB (ref 0–99)
NonHDL: 171.65
Triglycerides: 44 mg/dL (ref 0.0–149.0)
VLDL: 8.8 mg/dL (ref 0.0–40.0)

## 2017-12-28 MED ORDER — TRIAMCINOLONE ACETONIDE 0.1 % EX CREA: 1.0000 "application " | TOPICAL_CREAM | Freq: Two times a day (BID) | CUTANEOUS | 2 refills | Status: DC | PRN

## 2017-12-28 MED ORDER — TRIAMCINOLONE ACETONIDE 0.1 % EX CREA
1.0000 | TOPICAL_CREAM | Freq: Two times a day (BID) | CUTANEOUS | 2 refills | Status: DC | PRN
Start: 2017-12-28 — End: 2017-12-28

## 2017-12-28 NOTE — Progress Notes (Signed)
HPI:   [name] is a 67 y.o. male, who is here today with some concerns.  C/O  pruritic and erythematous rash on left-sided of abdomen for 2 years intermittently. He uses OTC Hydrocortisone and it helps.  He tells me that around same time rash started he changed detergent. He went back to old one and rash improved. Still has milder episodes of "red and itchy" non vesicular rash.  Hyperlipidemia:  Currently on non pharmacologic treatment. 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  Lab Results  Component Value Date   CHOL 269 (H) 06/22/2017   HDL 75 06/22/2017   LDLCALC 180 (H) 06/22/2017   TRIG 71 06/22/2017   CHOLHDL 2.9 08/14/2014   He is also requesting a lavage. He has had intermittent hearing loss right ear, mild.  In the past it has been caused by cerumen impaction, he has seen ENT to "clean" his aches. No earache or drainage.   Review of Systems  Constitutional: Negative for activity change, appetite change, fatigue and fever.  HENT: Positive for hearing loss. Negative for ear discharge, ear pain, nosebleeds and sore throat.   Respiratory: Negative for apnea, cough, shortness of breath and wheezing.   Cardiovascular: Negative for chest pain, palpitations and leg swelling.  Gastrointestinal: Negative for abdominal pain, nausea and vomiting.  Genitourinary: Negative for decreased urine volume and hematuria.  Skin: Positive for rash. Negative for wound.  Neurological: Negative for syncope, weakness and headaches.      Current Outpatient Medications on File Prior to Visit  Medication Sig Dispense Refill  . aspirin 81 MG tablet Take 81 mg by mouth daily.     No current facility-administered medications on file prior to visit.      Past Medical History:  Diagnosis Date  . Arthritis   . Bell's palsy 2016   Right sided.  Not clear when acutely suffered this.  Was noted on exam after  years of no medical attention 07/2014.  Evaluated by [facility]  subsequently.  Marland Kitchen Hyperlipidemia   . Stutter age 25   cannot recall if something happened in life that set this off.  . Weight loss 02/12/2017   No Known Allergies  Social History   Socioeconomic History  . Marital status: Single    Spouse name: Not on file  . Number of children: 0  . Years of education: 76  . Highest education level: 12th grade  Occupational History  . Occupation: Maintenance/Housekeeping    Employer: [employer]    Comment: 2016  Social Needs  . Financial resource strain: Hard  . Food insecurity:    Worry: Sometimes true    Inability: Sometimes true  . Transportation needs:    Medical: No    Non-medical: No  Tobacco Use  . Smoking status: Never Smoker  . Smokeless tobacco: Never Used  Substance and Sexual Activity  . Alcohol use: Yes    Alcohol/week: 0.0 oz    Comment: Once a monthly  . Drug use: No  . Sexual activity: Yes    Partners: Female  Lifestyle  . Physical activity:    Days per week: 7 days    Minutes per session: 60 min  . Stress: Only a little  Relationships  . Social connections:    Talks on phone: Twice a week    Gets together: Once a week    Attends  religious service: Never    Active member of club or organization: No    Attends meetings of clubs or organizations: Never    Relationship status: Never married  Other Topics Concern  . Not on file  Social History Narrative   Was living at a boarding house.   Patient was having difficulty affording food--reportedly because he lived in boarding house and did not have utilitiy bills, his EBT dollars were cut to $13 to $15 per month.   That has been changed.  He now receives adequate EBT dollars.      Now living at [facility].  This is for folks with a disability.  He has an apartment now with a kitchen.  The [place] near him set him up in these apartments that they support.     Grant Ruts  [phone]    Vitals:   12/28/17 0839  BP: 122/80  Pulse: 80  Resp: 16  Temp: 97.6 F (36.4 C)  SpO2: 97%   Body mass index is 23.82 kg/m.  Wt Readings from Last 3 Encounters:  12/28/17 185 lb 8 oz (84.1 kg)  08/30/17 184 lb 2 oz (83.5 kg)  06/22/17 161 lb (73 kg)     Physical Exam  Nursing note and vitals reviewed. Constitutional: He is oriented to person, place, and time. He appears well-developed. No distress.  HENT:  Head: Normocephalic and atraumatic.  Left Ear: Tympanic membrane, external ear and ear canal normal.  Mouth/Throat: Oropharynx is clear and moist and mucous membranes are normal.  Right ear canal with cerumen excess, I cannot see TM.  Eyes: Conjunctivae are normal.  Cardiovascular: Normal rate and regular rhythm.  No murmur heard. Pulses:      Dorsalis pedis pulses are 2+ on the right side, and 2+ on the left side.  Respiratory: Effort normal and breath sounds normal. No respiratory distress.  GI: Soft. He exhibits no mass. There is no tenderness.  Musculoskeletal: He exhibits no edema.  Lymphadenopathy:    He has no cervical adenopathy.  Neurological: He is alert and oriented to person, place, and time.  I do not appreciate focal deficit except for right facial palsy. Mildly unstable gait with no assistance.  Skin: Skin is warm. No rash noted. No erythema.  Mild hyperpigmented macular lesions on LLQ aspect of abdomen. No erythematous rash appreciated.  Psychiatric: He has a normal mood and affect. Cognition and memory are normal.  Well groomed, good eye contact.    ASSESSMENT AND PLAN:  Miguel Peters was seen today for follow-up.  Orders Placed This Encounter  Procedures  . Lipid panel   Lab Results  Component Value Date   CHOL 226 (H) 12/28/2017   HDL 54.10 12/28/2017   LDLCALC 163 (H) 12/28/2017   TRIG 44.0 12/28/2017   CHOLHDL 4 12/28/2017     Hyperlipidemia, unspecified hyperlipidemia type  Continue low fat diet. Further  recommendations will be given according to FLP results and 10 years ASCVD score.  The 10-year ASCVD risk score Mikey Bussing DC Brooke Bonito., et al., 2013) is: 9.2%   Values used to calculate the score:     Age: 25 years     Sex: Male     Is Non-Hispanic African American: Yes     Diabetic: No     Tobacco smoker: No     Systolic Blood Pressure: 277 mmHg     Is BP treated: No     HDL Cholesterol: 54.1 mg/dL     Total Cholesterol:  226 mg/dL  -     Lipid panel  Chronic pruritic rash in adult  Currently he has no rash. ? Eczema. Topical steroid to use as needed and for up to 14 days at the time.  -     Triamcinolone cream (KENALOG) 0.1 %; Apply 1 application topically 2 (two) times daily as needed.  Hearing loss due to cerumen impaction, right  Improved after ear lavage. Still some cerumen in ear canal, TM seen partially,no erythema. Recommend no Q tip use and OTC Debrox.     Amiyrah Lamere G. Martinique, MD  [facility] [place] office.

## 2017-12-28 NOTE — Patient Instructions (Addendum)
A few things to remember from today's visit:   Hyperlipidemia, unspecified hyperlipidemia type - Plan: Lipid panel  Chronic pruritic rash in adult - Plan: triamcinolone cream (KENALOG) 0.1 %  Hearing loss due to cerumen impaction, right  Debrox ear drops to remove rest of wax in right ear.   Please be sure medication list is accurate. If a new problem present, please set up appointment sooner than planned today.

## 2018-01-05 ENCOUNTER — Other Ambulatory Visit: Payer: Self-pay | Admitting: *Deleted

## 2018-01-05 MED ORDER — SIMVASTATIN 20 MG PO TABS: 20.0000 mg | ORAL_TABLET | Freq: Every day | ORAL | 3 refills | Status: DC

## 2018-02-21 ENCOUNTER — Encounter: Payer: Self-pay | Admitting: Family Medicine

## 2018-02-21 ENCOUNTER — Ambulatory Visit (INDEPENDENT_AMBULATORY_CARE_PROVIDER_SITE_OTHER): Payer: Medicare Other | Admitting: Family Medicine

## 2018-02-21 DIAGNOSIS — L298 Other pruritus: Secondary | ICD-10-CM | POA: Diagnosis not present

## 2018-02-21 MED ORDER — TRIAMCINOLONE ACETONIDE 0.1 % EX CREA: 1.0000 "application " | TOPICAL_CREAM | Freq: Two times a day (BID) | CUTANEOUS | 2 refills | Status: DC | PRN

## 2018-02-21 NOTE — Progress Notes (Signed)
ACUTE VISIT   HPI:  Chief Complaint  Patient presents with  . Rash    left side groin area and on left side of abdomen, started a few days ago, redness and irritation    Mr.Keir J Binion is a 67 y.o. male, who is here today complaining of pruritic erythematosus rash that started a couple days ago. He has had similar rash intermittently for years. Topical steroid cream seems to help "little."   Negative for new medication, detergent, soap, or body product. No known insect bite or outdoor exposures to plants. No sick contact.   He denies associated oral lesions/edema,cough, wheezing, dyspnea, abdominal pain, nausea, or vomiting.  According to patient, he has never been evaluated by dermatologist for this condition.  He has not identified exacerbating factors.  Problem seems to be stable.   Review of Systems  Constitutional: Negative for appetite change, fatigue and fever.  HENT: Negative for mouth sores and sore throat.   Respiratory: Negative for cough, shortness of breath and wheezing.   Gastrointestinal: Negative for abdominal pain, nausea and vomiting.  Genitourinary: Negative for decreased urine volume and hematuria.  Musculoskeletal: Negative for joint swelling and myalgias.  Skin: Positive for rash. Negative for wound.  Hematological: Negative for adenopathy. Does not bruise/bleed easily.      Current Outpatient Medications on File Prior to Visit  Medication Sig Dispense Refill  . aspirin 81 MG tablet Take 81 mg by mouth daily.    . simvastatin (ZOCOR) 20 MG tablet Take 1 tablet (20 mg total) by mouth at bedtime. 90 tablet 3   No current facility-administered medications on file prior to visit.      Past Medical History:  Diagnosis Date  . Arthritis   . Bell's palsy 2016   Right sided.  Not clear when acutely suffered this.  Was noted on exam after years of no medical attention 07/2014.  Evaluated by [facility]  subsequently.  Marland Kitchen  Hyperlipidemia   . Stutter age 67   cannot recall if something happened in life that set this off.  . Weight loss 02/12/2017   No Known Allergies  Social History   Socioeconomic History  . Marital status: Single    Spouse name: Not on file  . Number of children: 0  . Years of education: 56  . Highest education level: 12th grade  Occupational History  . Occupation: Maintenance/Housekeeping    Employer: [employer]    Comment: 2016  Social Needs  . Financial resource strain: Hard  . Food insecurity:    Worry: Sometimes true    Inability: Sometimes true  . Transportation needs:    Medical: No    Non-medical: No  Tobacco Use  . Smoking status: Never Smoker  . Smokeless tobacco: Never Used  Substance and Sexual Activity  . Alcohol use: Yes    Alcohol/week: 0.0 oz    Comment: Once a monthly  . Drug use: No  . Sexual activity: Yes    Partners: Female  Lifestyle  . Physical activity:    Days per week: 7 days    Minutes per session: 60 min  . Stress: Only a little  Relationships  . Social connections:    Talks on phone: Twice a week    Gets together: Once a week    Attends religious service: Never    Active member of club or organization: No    Attends meetings of clubs or organizations: Never  Relationship status: Never married  Other Topics Concern  . Not on file  Social History Narrative   Was living at a boarding house.   Patient was having difficulty affording food--reportedly because he lived in boarding house and did not have utilitiy bills, his EBT dollars were cut to $13 to $15 per month.   That has been changed.  He now receives adequate EBT dollars.      Now living at [facility].  This is for folks with a disability.  He has an apartment now with a kitchen.  The [place] near him set him up in these apartments that they support.     Grant Ruts [phone]    Vitals:   02/21/18 1100  BP: 118/78  Pulse: 66  Resp: 12  Temp: 97.7 F  (36.5 C)  SpO2: 97%   Body mass index is 23.95 kg/m.   Physical Exam  Nursing note and vitals reviewed. Constitutional: He appears well-developed. No distress.  HENT:  Head: Atraumatic.  Mouth/Throat: Oropharynx is clear and moist and mucous membranes are normal.  Eyes: Conjunctivae are normal.  Neck: No tracheal deviation present.  Respiratory: Effort normal and breath sounds normal. No respiratory distress.  Musculoskeletal: He exhibits no edema.  Lymphadenopathy:    He has no cervical adenopathy.  Neurological: He has normal strength.  Unstable gait with no assistance.  Skin: Skin is warm. Rash noted. Rash is maculopapular. Rash is not vesicular. No erythema.     Erythematous, confluent rash on left inner thigh, waist left-sided, and left gluteal. There is no clear center. Rash is not tender upon palpation.  Psychiatric: His mood appears anxious.  Well groomed, good eye contact.      ASSESSMENT AND PLAN:   Mr.Shamir was seen today for rash.  Diagnoses and all orders for this visit:  Chronic pruritic rash in adult -     triamcinolone cream (KENALOG) 0.1 %; Apply 1 application topically 2 (two) times daily as needed. -     Ambulatory referral to Dermatology   We discussed possible etiologies,?  Eczema, medication reaction. Topical triamcinolone twice daily for up to 14 days recommended. Because problem has been chronic/recurrent, he would like dermatology evaluation.    Return if symptoms worsen or fail to improve.      Betty G. Martinique, MD  [facility]. [place] office.

## 2018-02-21 NOTE — Patient Instructions (Signed)
A few things to remember from today's visit:   Chronic pruritic rash in adult - Plan: triamcinolone cream (KENALOG) 0.1 %, Ambulatory referral to Dermatology  No changes in topical steroid cream. Appointment with dermatologist will be arranged.  Please be sure medication list is accurate. If a new problem present, please set up appointment sooner than planned today.

## 2018-02-24 ENCOUNTER — Encounter: Payer: Self-pay | Admitting: Family Medicine

## 2018-03-24 DIAGNOSIS — L304 Erythema intertrigo: Secondary | ICD-10-CM | POA: Diagnosis not present

## 2018-03-24 DIAGNOSIS — L408 Other psoriasis: Secondary | ICD-10-CM | POA: Diagnosis not present

## 2018-04-22 DIAGNOSIS — L304 Erythema intertrigo: Secondary | ICD-10-CM | POA: Diagnosis not present

## 2018-04-22 DIAGNOSIS — L408 Other psoriasis: Secondary | ICD-10-CM | POA: Diagnosis not present

## 2018-09-02 NOTE — Progress Notes (Signed)
Subjective:   Miguel Peters is a 68 y.o. male who presents for an Initial Medicare Annual Wellness Visit.  Review of Systems  No ROS.  Medicare Wellness Visit. Additional risk factors are reflected in the social history.  Cardiac Risk Factors include: advanced age (>50men, >28 women);male gender;dyslipidemia Sleep patterns: feels rested on waking, gets up 2 times nightly to void and sleeps 6 hours nightly.    Home Safety/Smoke Alarms: Feels safe in home. Smoke alarms in place.  Living environment; residence and Firearm Safety: apartment, one level living. No use or need for DME at this time.  Seat Belt Safety/Bike Helmet: Wears seat belt.    Male:   CCS- 09/2014, due 09/2019 per pt. Report. Pt. believe he was told to come back in 5 years    PSA- will mostly likely be rechecked at CPE scheduled with PCP for 2/17, Lab Results  Component Value Date   PSA 1.53 08/14/2014      Objective:    Today's Vitals   09/05/18 1135  BP: 118/76  Pulse: 80  Resp: 16  Temp: 98.2 F (36.8 C)  TempSrc: Oral  SpO2: 97%  Weight: 183 lb (83 kg)  Height: 6\' 3"  (1.905 m)  PainSc: 4   PainLoc: Back   Body mass index is 22.87 kg/m.  Advanced Directives 09/05/2018 06/22/2017 10/05/2014 09/19/2014  Does Patient Have a Medical Advance Directive? No No No No  Would patient like information on creating a medical advance directive? Yes (MAU/Ambulatory/Procedural Areas - Information given) Yes (MAU/Ambulatory/Procedural Areas - Information given) - -    Current Medications (verified) Outpatient Encounter Medications as of 09/05/2018  Medication Sig  . aspirin 81 MG tablet Take 81 mg by mouth daily.  . simvastatin (ZOCOR) 20 MG tablet Take 1 tablet (20 mg total) by mouth at bedtime.  . triamcinolone cream (KENALOG) 0.1 % Apply 1 application topically 2 (two) times daily as needed.   No facility-administered encounter medications on file as of 09/05/2018.     Allergies (verified) Patient has  no known allergies.   History: Past Medical History:  Diagnosis Date  . Arthritis   . Bell's palsy 2016   Right sided.  Not clear when acutely suffered this.  Was noted on exam after years of no medical attention 07/2014.  Evaluated by [facility]  subsequently.  Marland Kitchen Hyperlipidemia   . Stutter age 65   cannot recall if something happened in life that set this off.  . Weight loss 02/12/2017   Past Surgical History:  Procedure Laterality Date  . hand surgery     thumb  . TONSILLECTOMY     Family History  Problem Relation Age of Onset  . Sickle cell anemia Mother   . Kidney disease Father   . Alcohol abuse Father   . Colon cancer Neg Hx    Social History   Socioeconomic History  . Marital status: Single    Spouse name: Not on file  . Number of children: 0  . Years of education: 26  . Highest education level: 12th grade  Occupational History  . Occupation: Maintenance/Housekeeping    Employer: [employer]    Comment: 2016  Social Needs  . Financial resource strain: Somewhat hard  . Food insecurity:    Worry: Sometimes true    Inability: Sometimes true  . Transportation needs:    Medical: No    Non-medical: No  Tobacco Use  . Smoking status: Never Smoker  . Smokeless  tobacco: Never Used  Substance and Sexual Activity  . Alcohol use: Yes    Alcohol/week: 0.0 standard drinks    Comment: Once a monthly  . Drug use: No  . Sexual activity: Yes    Partners: Female  Lifestyle  . Physical activity:    Days per week: 7 days    Minutes per session: 60 min  . Stress: Only a little  Relationships  . Social connections:    Talks on phone: Twice a week    Gets together: Once a week    Attends religious service: Never    Active member of club or organization: No    Attends meetings of clubs or organizations: Never    Relationship status: Never married  Other Topics Concern  . Not on file  Social History Narrative   Was living at a boarding house.     Patient was having difficulty affording food--reportedly because he lived in boarding house and did not have utilitiy bills, his EBT dollars were cut to $13 to $15 per month.   That has been changed.  He now receives adequate EBT dollars.      Now living at [facility].  This is for folks with a disability.  He has an apartment now with a kitchen.  The [place] near him set him up in these apartments that they support.     Grant Ruts [phone]      09/05/18: Pt. Still living at [facility] [address], likes it. Sometimes participates in events hosted by center there.   Lives alone, but sister local, and main source of support.   Enjoys doing own workout routine with weights and walking. Enjoys reading.      Tobacco Counseling Counseling given: Not Answered  Activities of Daily Living In your present state of health, do you have any difficulty performing the following activities: 09/05/2018  Hearing? N  Vision? N  Difficulty concentrating or making decisions? N  Walking or climbing stairs? N  Dressing or bathing? N  Doing errands, shopping? N  Preparing Food and eating ? N  Using the Toilet? N  In the past six months, have you accidently leaked urine? N  Do you have problems with loss of bowel control? N  Managing your Medications? N  Managing your Finances? N  Housekeeping or managing your Housekeeping? N  Some recent data might be hidden     Immunizations and Health Maintenance Immunization History  Administered Date(s) Administered  . Influenza-Unspecified 06/26/2018  . Pneumococcal Conjugate-13 09/05/2018  . Tdap 06/22/2017   Health Maintenance Due  Topic Date Due  . Hepatitis C Screening  1951-04-25    Patient Care Team: Martinique, Betty G, MD as PCP - General (Family Medicine)  Indicate any recent Medical Services you may have received from other than [facility] providers in the past year (date may be approximate).    Assessment:   This is a routine wellness  examination for [facility]. Physical assessment deferred to PCP.   Hearing/Vision screen  Hearing Screening   125Hz  250Hz  500Hz  1000Hz  2000Hz  3000Hz  4000Hz  6000Hz  8000Hz   Right ear:   Fail Fail Fail  Fail    Left ear:   Pass Pass Pass  Pass    Comments: Pt. Denies any hearing issues, able to hear conversational tones without difficulty. Author advised to follow-up with PCP, perhaps have ears cleaned. Appointment scheduled   Visual Acuity Screening   Right eye Left eye Both eyes  Without correction: 20/20 20/30 20/20  With correction:     Comments: No vision issues reported. Author encouraged pt. to follow up with eye doctor regardless, as pt. Has not seen one "in awhile".     Dietary issues and exercise activities discussed: Current Exercise Habits: Home exercise routine, Type of exercise: calisthenics;strength training/weights;walking, Time (Minutes): 30, Frequency (Times/Week): 7, Weekly Exercise (Minutes/Week): 210, Intensity: Moderate, Exercise limited by: orthopedic condition(s);cardiac condition(s)  Diet (meal preparation, eat out, water intake, caffeinated beverages, dairy products, fruits and vegetables): well balanced, eats 2 meals/day per pt. Have noted 5 lb. weight loss since July but pt. States appetite is still good. Pt. States he is a tea drinker, one beer daily. Author encouraged pt. To drink more water, and continue to eat high protein foods. Low cholesterol diet tips provided as well given most recent cholesterol reading of 226. Pt. Plans to follow up with PCP on 2/17.       Goals    . Patient Stated     Maintaining current health status, maybe look into silver sneakers      Depression Screen PHQ 2/9 Scores 09/05/2018 08/30/2017 08/14/2014  PHQ - 2 Score 0 0 0  PHQ- 9 Score 0 - -    Fall Risk Fall Risk  09/05/2018 08/14/2014  Falls in the past year? 0 No     Cognitive Function:       Ad8 score reviewed for issues:  Issues making decisions: no  Less interest in  hobbies / activities: no  Repeats questions, stories (family complaining): no  Trouble using ordinary gadgets (microwave, computer, phone):no  Forgets the month or year: no  Mismanaging finances: no  Remembering appts: no  Daily problems with thinking and/or memory: no Ad8 score is= 0  Screening Tests Health Maintenance  Topic Date Due  . Hepatitis C Screening  03/12/1951  . PNA vac Low Risk Adult (2 of 2 - PPSV23) 09/06/2019  . COLONOSCOPY  10/05/2019  . TETANUS/TDAP  06/23/2027  . INFLUENZA VACCINE  Completed      Plan:    Refer to low back pain exercises you can do to avoid pain/injury.   Follow-up with PCP about possible R ear earwax buildup, routine annual labwork at physical next Monday 2/17.   Increase water intake, especially prior to and after working out (at least 4 large glasses of water daily). Make sure you have diet high in protein.  Pneumonia vaccine given.   Hepatitis C screening ordered for future draw on 2/17 pending CPE.  Follow up with dermatology as scheduled in March.  Review advance care directive booklet, and let us know if you have any questions at your next visit.  Please let us know if you need anything else! Very nice meeting you today!  I have personally reviewed and noted the following in the patient's chart:   . Medical and social history . Use of alcohol, tobacco or illicit drugs  . Current medications and supplements . Functional ability and status . Nutritional status . Physical activity . Advanced directives . List of other physicians . Hospitalizations, surgeries, and ER visits in previous 12 months . Vitals . Screenings to include cognitive, depression, and falls . Referrals and appointments  In addition, I have reviewed and discussed with patient certain preventive protocols, quality metrics, and best practice recommendations. A written personalized care plan for preventive services as well as general preventive health  recommendations were provided to patient.     Alphia Moh, RN   09/05/2018

## 2018-09-05 ENCOUNTER — Ambulatory Visit (INDEPENDENT_AMBULATORY_CARE_PROVIDER_SITE_OTHER): Payer: Medicare Other

## 2018-09-05 VITALS — BP 118/76 | HR 80 | Temp 98.2°F | Resp 16 | Ht 75.0 in | Wt 183.0 lb

## 2018-09-05 DIAGNOSIS — Z1159 Encounter for screening for other viral diseases: Secondary | ICD-10-CM | POA: Diagnosis not present

## 2018-09-05 DIAGNOSIS — Z23 Encounter for immunization: Secondary | ICD-10-CM | POA: Diagnosis not present

## 2018-09-05 DIAGNOSIS — Z0000 Encounter for general adult medical examination without abnormal findings: Secondary | ICD-10-CM

## 2018-09-05 NOTE — Patient Instructions (Addendum)
Refer to low back pain exercises you can do to avoid pain/injury.   Follow-up with PCP about possible R ear earwax buildup, routine annual labwork at physical next Monday 2/17.   Increase water intake, especially prior to and after working out (at least 4 large glasses of water daily). Make sure you have diet high in protein.  Pneumonia vaccine given.   Follow up with dermatology as scheduled in March.  Review advance care directive booklet, and let us know if you have any questions at your next visit.  Please let us know if you need anything else! Very nice meeting you today!    [name] , Thank you for taking time to come for your Medicare Wellness Visit. I appreciate your ongoing commitment to your health goals. Please review the following plan we discussed and let me know if I can assist you in the future.   These are the goals we discussed: Goals    . Patient Stated     Maintaining current health status, maybe look into silver sneakers       This is a list of the screening recommended for you and due dates:  Health Maintenance  Topic Date Due  .  Hepatitis C: One time screening is recommended by Center for Disease Control  (CDC) for  adults born from [birth year] through 1963.   [birth year]  . Pneumonia vaccines (1 of 2 - PCV13) 08/24/2017  . Flu Shot  02/24/2018  . Colon Cancer Screening  10/05/2019  . Tetanus Vaccine  06/23/2027    What You Need to Know About Chronic Back Pain Long-term (chronic) back pain is back pain that lasts for 12 weeks or longer. It often affects the lower back and can range from mild to severe. Many people have back pain at some point in their lives. It can feel different to each person. It may feel like a muscle ache or a sharp, stabbing pain. The pain often gets worse over time. It can be difficult to find the cause of chronic back pain. Treating chronic back pain often starts with rest and pain relief, followed by exercises (physical therapy) to  strengthen the muscles that support your back. You may have to try different things to see what works best for you. If other treatments do not help, or if your pain is caused by a condition or an injury, you may need surgery. How can back pain affect me? Chronic back pain is uncomfortable and can make it hard to do your usual daily activities. Chronic back pain can:  Cause numbness and tingling.  Come and go.  Get worse when you are sitting, standing, walking, bending, or lifting.  Affect you while you are active, at rest, or both.  Eventually make it hard to move around.  Occur with fever, weight loss, or difficulty urinating. What are the benefits of treating back pain? Treating chronic back pain may:  Relieve pain.  Keep your pain from getting worse.  Make it easier for you to do your usual activities. What are some steps I can take to decrease my back pain?   Take over-the-counter or prescription medicines only as told by your health care provider.  If directed, apply heat to the affected area. Use the heat source that your health care provider recommends, such as a moist heat pack or a heating pad. ? Place a towel between your skin and the heat source. ? Leave the heat on for 20-30 minutes. ? Remove the  heat if your skin turns bright red. This is especially important if you are unable to feel pain, heat, or cold. You may have a greater risk of getting burned.  If directed, put ice on the affected area: ? Put ice in a plastic bag. ? Place a towel between your skin and the bag. ? Leave the ice on for 20 minutes, 2-3 times a day.  Get regular exercise as told by your health care provider to improve flexibility and strength.  Do not smoke.  Maintain a healthy weight.  When lifting objects: ? Keep your feet as far apart as your shoulders (shoulder-width apart) or farther apart. ? Tighten the muscles in your abdomen. ? Bend your knees and hips and keep your spine  neutral. It is important to lift using the strength of your legs, not your back. Do not lock your knees straight out. ? Always ask for help to lift heavy or awkward objects. What can happen if my back pain goes untreated? Untreated back pain can:  Get worse over time.  Start to occur more often or at different times, such as when you are resting.  Cause posture problems.  Make it hard to move around (limit mobility). Where can I get support? Chronic back pain can be a frustrating condition to manage. It may help to talk with other people who are having a similar experience. Consider joining a support group for people dealing with chronic back pain. Ask your health care provider about support groups in your area. You can also find online and in-person support groups through:  The American Chronic Pain Association: DeluxeOption.si  The U[place] Pain Foundation: uspainfoundation.org/support-groups Contact a health care provider if:  Your symptoms do not get better or they get worse.  You have severe back pain.  You have chronic back pain and a fever.  You lose weight without trying.  You have difficulty urinating.  You experience numbness or tingling.  You develop new pain after an injury. Summary  Chronic back pain is often treated with rest, pain relief, and physical therapy.  Get regular exercise to improve your strength and flexibility.  Put heat and ice on the affected areas as directed by your health care provider.  Chronic back pain can be challenging to live with. Joining a support group may help you manage your condition. This information is not intended to replace advice given to you by your health care provider. Make sure you discuss any questions you have with your health care provider. Document Released: 07/28/2015 Document Revised: 03/21/2016 Document Reviewed: 03/21/2016 Elsevier Interactive Patient Education  2019 [place].   Core  Strength Exercises  Core exercises help to build strength in the muscles between your ribs and your hips (abdominal muscles). These muscles help to support your body and keep your spine stable. It is important to maintain strength in your core to prevent injury and pain. Some activities, such as yoga and Pilates, can help to strengthen core muscles. You can also strengthen core muscles with exercises at home. It is important to talk to your health care provider before you start a new exercise routine. What are the benefits of core strength exercises? Core strength exercises can:  Reduce back pain.  Help to rebuild strength after a back or spine injury.  Help to prevent injury during physical activity, especially injuries to the back and knees. How to do core strength exercises Repeat these exercises 10-15 times, or until you are tired. Do exercises exactly  as told by your health care provider and adjust them as directed. It is normal to feel mild stretching, pulling, tightness, or discomfort as you do these exercises. If you feel any pain while doing these exercises, stop. If your pain continues or gets worse when doing core exercises, contact your health care provider. You may want to use a padded yoga or exercise mat for strength exercises that are done on the floor. Bridging  1. Lie on your back on a firm surface with your knees bent and your feet flat on the floor. 2. Raise your hips so that your knees, hips, and shoulders form a straight line together. Keep your abdominal muscles tight. 3. Hold this position for 3-5 seconds. 4. Slowly lower your hips to the starting position. 5. Let your muscles relax completely between repetitions. Single-leg bridge 1. Lie on your back on a firm surface with your knees bent and your feet flat on the floor. 2. Raise your hips so that your knees, hips, and shoulders form a straight line together. Keep your abdominal muscles tight. 3. Lift one foot off  the floor, then completely straighten that leg. 4. Hold this position for 3-5 seconds. 5. Put the straight leg back down in the bent position. 6. Slowly lower your hips to the starting position. 7. Repeat these steps using your other leg. Side bridge 1. Lie on your side with your knees bent. Prop yourself up on the elbow that is near the floor. 2. Using your abdominal muscles and your elbow that is on the floor, raise your body off the floor. Raise your hip so that your shoulder, hip, and foot form a straight line together. 3. Hold this position for 10 seconds. Keep your head and neck raised and away from your shoulder (in their normal, neutral position). Keep your abdominal muscles tight. 4. Slowly lower your hip to the starting position. 5. Repeat and try to hold this position longer, working your way up to 30 seconds. Abdominal crunch 1. Lie on your back on a firm surface. Bend your knees and keep your feet flat on the floor. 2. Cross your arms over your chest. 3. Without bending your neck, tip your chin slightly toward your chest. 4. Tighten your abdominal muscles as you lift your chest just high enough to lift your shoulder blades off of the floor. Do not hold your breath. You can do this with short lifts or long lifts. 5. Slowly return to the starting position. Bird dog 1. Get on your hands and knees, with your legs shoulder-width apart and your arms under your shoulders. Keep your back straight. 2. Tighten your abdominal muscles. 3. Raise one of your legs off the floor and straighten it. Try to keep it parallel to the floor. 4. Slowly lower your leg to the starting position. 5. Raise one of your arms off the floor and straighten it. Try to keep it parallel to the floor. 6. Slowly lower your arm to the starting position. 7. Repeat with the other arm and leg. If possible, try raising a leg and arm at the same time, on opposite sides of the body. For example, raise your left hand and your  right leg. Plank 1. Lie on your belly. 2. Prop up your body onto your forearms and your feet, keeping your legs straight. Your body should make a straight line between your shoulders and feet. 3. Hold this position for 10 seconds while keeping your abdominal muscles tight. 4. Lower your body  to the starting position. 5. Repeat and try to hold this position longer, working your way up to 30 seconds. Cross-core strengthening 1. Stand with your feet shoulder-width apart. 2. Hold a ball out in front of you. Keep your arms straight. 3. Tighten your abdominal muscles and slowly rotate at your waist from side to side. Keep your feet flat. 4. Once you are comfortable, try repeating this exercise with a heavier ball. Top core strengthening 1. Stand about 18 inches (46 cm) in front of a wall, with your back to the wall. 2. Keep your feet flat and shoulder-width apart. 3. Tighten your abdominal muscles. 4. Bend your hips and knees. 5. Slowly reach between your legs to touch the wall behind you. 6. Slowly stand back up. 7. Raise your arms over your head and reach behind you. 8. Return to the starting position. General tips  Do not do any exercises that cause pain. If you have pain while exercising, talk to your health care provider.  Always stretch before and after doing these exercises. This can help prevent injury.  Maintain a healthy weight. Ask your health care provider what weight is healthy for you. Contact a health care provider if:  You have back pain that gets worse or does not go away.  You feel pain while doing core strength exercises. Get help right away if:  You have severe pain that does not get better with medicine. Summary  Core exercises help to build strength in the muscles between your ribs and your waist.  Core muscles help to support your body and keep your spine stable.  Some activities, such as yoga and Pilates, can help to strengthen core muscles.  Core  strength exercises can help back pain and can prevent injury.  If you feel any pain while doing core strength exercises, stop. This information is not intended to replace advice given to you by your health care provider. Make sure you discuss any questions you have with your health care provider. Document Released: 12/02/2016 Document Revised: 12/02/2016 Document Reviewed: 12/02/2016 Elsevier Interactive Patient Education  2019 [place] and Cholesterol Restricted Eating Plan Eating a diet that limits fat and cholesterol may help lower your risk for heart disease and other conditions. Your body needs fat and cholesterol for basic functions, but eating too much of these things can be harmful to your health. Your health care provider may order lab tests to check your blood fat (lipid) and cholesterol levels. This helps your health care provider understand your risk for certain conditions and whether you need to make diet changes. Work with your health care provider or dietitian to make an eating plan that is right for you. Your plan includes:  Limit your fat intake to ______% or less of your total calories a day.  Limit your saturated fat intake to ______% or less of your total calories a day.  Limit the amount of cholesterol in your diet to less than _________mg a day.  Eat ___________ g of fiber a day. What are tips for following this plan? General guidelines   If you are overweight, work with your health care provider to lose weight safely. Losing just 5-10% of your body weight can improve your overall health and help prevent diseases such as diabetes and heart disease.  Avoid: ? Foods with added sugar. ? Fried foods. ? Foods that contain partially hydrogenated oils, including stick margarine, some tub margarines, cookies, crackers, and other baked goods.  Limit  alcohol intake to no more than 1 drink a day for nonpregnant women and 2 drinks a day for men. One drink equals  12 oz of beer, 5 oz of wine, or 1 oz of hard liquor. Reading food labels  Check food labels for: ? Trans fats, partially hydrogenated oils, or high amounts of saturated fat. Avoid foods that contain saturated fat and trans fat. ? The amount of cholesterol in each serving. Try to eat no more than 200 mg of cholesterol each day. ? The amount of fiber in each serving. Try to eat at least 20-30 g of fiber each day.  Choose foods with healthy fats, such as: ? Monounsaturated and polyunsaturated fats. These include olive and canola oil, flaxseeds, walnuts, almonds, and seeds. ? Omega-3 fats. These are found in foods such as salmon, mackerel, sardines, tuna, flaxseed oil, and ground flaxseeds.  Choose grain products that have whole grains. Look for the word "whole" as the first word in the ingredient list. Cooking  Cook foods using methods other than frying. Baking, boiling, grilling, and broiling are some healthy options.  Eat more home-cooked food and less restaurant, buffet, and fast food.  Avoid cooking using saturated fats. ? Animal sources of saturated fats include meats, butter, and cream. ? Plant sources of saturated fats include palm oil, palm kernel oil, and coconut oil. Meal planning   At meals, imagine dividing your plate into fourths: ? Fill one-half of your plate with vegetables and green salads. ? Fill one-fourth of your plate with whole grains. ? Fill one-fourth of your plate with lean protein foods.  Eat fish that is high in omega-3 fats at least two times a week.  Eat more foods that contain fiber, such as whole grains, beans, apples, broccoli, carrots, peas, and barley. These foods help promote healthy cholesterol levels in the blood. Recommended foods Grains  Whole grains, such as whole wheat or whole grain breads, crackers, cereals, and pasta. Unsweetened oatmeal, bulgur, barley, quinoa, or brown rice. Corn or whole wheat flour tortillas. Vegetables  Fresh or  frozen vegetables (raw, steamed, roasted, or grilled). Green salads. Fruits  All fresh, canned (in natural juice), or frozen fruits. Meats and other protein foods  Ground beef (85% or leaner), grass-fed beef, or beef trimmed of fat. Skinless chicken or [place]. Ground chicken or [place]. Pork trimmed of fat. All fish and seafood. Egg whites. Dried beans, peas, or lentils. Unsalted nuts or seeds. Unsalted canned beans. Natural nut butters without added sugar and oil. Dairy  Low-fat or nonfat dairy products, such as skim or 1% milk, 2% or reduced-fat cheeses, low-fat and fat-free ricotta or cottage cheese, or plain low-fat and nonfat yogurt. Fats and oils  Tub margarine without trans fats. Light or reduced-fat mayonnaise and salad dressings. Avocado. Olive, canola, sesame, or safflower oils. The items listed above may not be a complete list of recommended foods or beverages. Contact your dietitian for more options. Foods to avoid Grains  White bread. White pasta. White rice. Cornbread. Bagels, pastries, and croissants. Crackers and snack foods that contain trans fat and hydrogenated oils. Vegetables  Vegetables cooked in cheese, cream, or butter sauce. Fried vegetables. Fruits  Canned fruit in heavy syrup. Fruit in cream or butter sauce. Fried fruit. Meats and other protein foods  Fatty cuts of meat. Ribs, chicken wings, bacon, sausage, bologna, salami, chitterlings, fatback, hot dogs, bratwurst, and packaged lunch meats. Liver and organ meats. Whole eggs and egg yolks. Chicken and [place] with skin. Fried meat.  Dairy  Whole or 2% milk, cream, half-and-half, and cream cheese. Whole milk cheeses. Whole-fat or sweetened yogurt. Full-fat cheeses. Nondairy creamers and whipped toppings. Processed cheese, cheese spreads, and cheese curds. Beverages  Alcohol. Sugar-sweetened drinks such as sodas, lemonade, and fruit drinks. Fats and oils  Butter, stick margarine, lard, shortening, ghee, or  bacon fat. Coconut, palm kernel, and palm oils. Sweets and desserts  Corn syrup, sugars, honey, and molasses. Candy. Jam and jelly. Syrup. Sweetened cereals. Cookies, pies, cakes, donuts, muffins, and ice cream. The items listed above may not be a complete list of foods and beverages to avoid. Contact your dietitian for more information. Summary  Your body needs fat and cholesterol for basic functions. However, eating too much of these things can be harmful to your health.  Work with your health care provider and dietitian to follow a diet low in fat and cholesterol. Doing this may help lower your risk for heart disease and other conditions.  Choose healthy fats, such as monounsaturated and polyunsaturated fats, and foods high in omega-3 fatty acids.  Eat fiber-rich foods, such as whole grains, beans, peas, fruits, and vegetables.  Limit or avoid alcohol, fried foods, and foods high in saturated fats, partially hydrogenated oils, and sugar. This information is not intended to replace advice given to you by your health care provider. Make sure you discuss any questions you have with your health care provider. Document Released: 07/13/2005 Document Revised: 03/30/2017 Document Reviewed: 03/30/2017 Elsevier Interactive Patient Education  2019 [place] Maintenance, Male A healthy lifestyle and preventive care is important for your health and wellness. Ask your health care provider about what schedule of regular examinations is right for you. What should I know about weight and diet? Eat a Healthy Diet  Eat plenty of vegetables, fruits, whole grains, low-fat dairy products, and lean protein.  Do not eat a lot of foods high in solid fats, added sugars, or salt.  Maintain a Healthy Weight Regular exercise can help you achieve or maintain a healthy weight. You should:  Do at least 150 minutes of exercise each week. The exercise should increase your heart rate and make you  sweat (moderate-intensity exercise).  Do strength-training exercises at least twice a week. Watch Your Levels of Cholesterol and Blood Lipids  Have your blood tested for lipids and cholesterol every 5 years starting at 66 years of age. If you are at high risk for heart disease, you should start having your blood tested when you are 66 years old. You may need to have your cholesterol levels checked more often if: ? Your lipid or cholesterol levels are high. ? You are older than 66 years of age. ? You are at high risk for heart disease. What should I know about cancer screening? Many types of cancers can be detected early and may often be prevented. Lung Cancer  You should be screened every year for lung cancer if: ? You are a current smoker who has smoked for at least 30 years. ? You are a former smoker who has quit within the past 15 years.  Talk to your health care provider about your screening options, when you should start screening, and how often you should be screened. Colorectal Cancer  Routine colorectal cancer screening usually begins at 66 years of age and should be repeated every 5-10 years until you are 66 years old. You may need to be screened more often if early forms of precancerous polyps or small growths  are found. Your health care provider may recommend screening at an earlier age if you have risk factors for colon cancer.  Your health care provider may recommend using home test kits to check for hidden blood in the stool.  A small camera at the end of a tube can be used to examine your colon (sigmoidoscopy or colonoscopy). This checks for the earliest forms of colorectal cancer. Prostate and Testicular Cancer  Depending on your age and overall health, your health care provider may do certain tests to screen for prostate and testicular cancer.  Talk to your health care provider about any symptoms or concerns you have about testicular or prostate cancer. Skin  Cancer  Check your skin from head to toe regularly.  Tell your health care provider about any new moles or changes in moles, especially if: ? There is a change in a mole's size, shape, or color. ? You have a mole that is larger than a pencil eraser.  Always use sunscreen. Apply sunscreen liberally and repeat throughout the day.  Protect yourself by wearing long sleeves, pants, a wide-brimmed hat, and sunglasses when outside. What should I know about heart disease, diabetes, and high blood pressure?  If you are 84-10 years of age, have your blood pressure checked every 3-5 years. If you are 3 years of age or older, have your blood pressure checked every year. You should have your blood pressure measured twice-once when you are at a hospital or clinic, and once when you are not at a hospital or clinic. Record the average of the two measurements. To check your blood pressure when you are not at a hospital or clinic, you can use: ? An automated blood pressure machine at a pharmacy. ? A home blood pressure monitor.  Talk to your health care provider about your target blood pressure.  If you are between 81-66 years old, ask your health care provider if you should take aspirin to prevent heart disease.  Have regular diabetes screenings by checking your fasting blood sugar level. ? If you are at a normal weight and have a low risk for diabetes, have this test once every three years after the age of 67. ? If you are overweight and have a high risk for diabetes, consider being tested at a younger age or more often.  A one-time screening for abdominal aortic aneurysm (AAA) by ultrasound is recommended for men aged 48-75 years who are current or former smokers. What should I know about preventing infection? Hepatitis B If you have a higher risk for hepatitis B, you should be screened for this virus. Talk with your health care provider to find out if you are at risk for hepatitis B  infection. Hepatitis C Blood testing is recommended for:  Everyone born from [birth year] through 1963.  Anyone with known risk factors for hepatitis C. Sexually Transmitted Diseases (STDs)  You should be screened each year for STDs including gonorrhea and chlamydia if: ? You are sexually active and are younger than 66 years of age. ? You are older than 66 years of age and your health care provider tells you that you are at risk for this type of infection. ? Your sexual activity has changed since you were last screened and you are at an increased risk for chlamydia or gonorrhea. Ask your health care provider if you are at risk.  Talk with your health care provider about whether you are at high risk of being infected with HIV. Your health  care provider may recommend a prescription medicine to help prevent HIV infection. What else can I do?  Schedule regular health, dental, and eye exams.  Stay current with your vaccines (immunizations).  Do not use any tobacco products, such as cigarettes, chewing tobacco, and e-cigarettes. If you need help quitting, ask your health care provider.  Limit alcohol intake to no more than 2 drinks per day. One drink equals 12 ounces of beer, 5 ounces of wine, or 1 ounces of hard liquor.  Do not use street drugs.  Do not share needles.  Ask your health care provider for help if you need support or information about quitting drugs.  Tell your health care provider if you often feel depressed.  Tell your health care provider if you have ever been abused or do not feel safe at home. This information is not intended to replace advice given to you by your health care provider. Make sure you discuss any questions you have with your health care provider. Document Released: 01/09/2008 Document Revised: 03/11/2016 Document Reviewed: 04/16/2015 Elsevier Interactive Patient Education  2019 [employer].

## 2018-09-11 NOTE — Progress Notes (Signed)
HPI:   [name] is a 68 y.o. male, who is here today for his routine physical.  Last CPE: 06/22/17. Recently he had her Medicare Preventive visit.  Regular exercise 3 or more time per week: Yes, he is walking and stretching , at least 3-4 times per week. Following a healthy diet: He tries to stay away from fried food. He lives alone, sister close by.  Chronic medical problems: Hyperlipidemia, history of Bell's palsy, lower back pain, and pruritic rash (intermittent).   Immunization History  Administered Date(s) Administered  . Influenza-Unspecified 06/26/2018  . Pneumococcal Conjugate-13 09/05/2018  . Tdap 06/22/2017   Colonoscopy: 10/05/2014. Prostate cancer screening:   PSA 1.53 08/14/2014   Nocturia x 1.  Denies dysuria,increased urinary frequency, gross hematuria,or decreased urine output.  Hep C screening: Never   HLD: Currently on Zocor 20 mg daily. He is tolerating medication well. He is also following low-fat diet.  Lab Results  Component Value Date   CHOL 226 (H) 12/28/2017   HDL 54.10 12/28/2017   LDLCALC 163 (H) 12/28/2017   TRIG 44.0 12/28/2017   CHOLHDL 4 12/28/2017   During examination I noted irregular heart rate. He believes somebody has mentioned irregular heart rate in the past. He denies chest pain, palpitation, dyspnea, or dizziness.  He is also complaining of decreased hearing, rigth ear "clogged up." Problem has been going on for a couple months. He denies any ear drainage or earache. He uses Q-tips regularly. He has not tried OTC medications.   Review of Systems  Constitutional: Negative for activity change, appetite change, fatigue and fever.  HENT: Positive for hearing loss. Negative for dental problem, ear pain, nosebleeds, rhinorrhea, sore throat, trouble swallowing and voice change (stutter).   Eyes: Negative for redness and visual disturbance.  Respiratory: Negative for cough, shortness of breath and  wheezing.   Cardiovascular: Negative for chest pain, palpitations and leg swelling.  Gastrointestinal: Negative for abdominal pain, blood in stool, nausea and vomiting.  Endocrine: Negative for polydipsia, polyphagia and polyuria.  Genitourinary: Negative for decreased urine volume, dysuria, genital sores, hematuria and testicular pain.  Musculoskeletal: Positive for arthralgias and back pain. Negative for joint swelling and myalgias.  Skin: Negative for color change and rash.  Allergic/Immunologic: Negative for environmental allergies.  Neurological: Negative for dizziness, syncope, weakness and headaches.  Psychiatric/Behavioral: Negative for confusion and sleep disturbance. The patient is not nervous/anxious.   All other systems reviewed and are negative.   Current Outpatient Medications on File Prior to Visit  Medication Sig Dispense Refill  . aspirin 81 MG tablet Take 81 mg by mouth daily.    . calcipotriene (DOVONOX) 0.005 % cream APP TOPICALLY TO GROIN AND BUTTOCKS BID    . simvastatin (ZOCOR) 20 MG tablet Take 1 tablet (20 mg total) by mouth at bedtime. 90 tablet 3  . triamcinolone cream (KENALOG) 0.1 % Apply 1 application topically 2 (two) times daily as needed. 30 g 2   No current facility-administered medications on file prior to visit.      Past Medical History:  Diagnosis Date  . Arthritis   . Bell's palsy 2016   Right sided.  Not clear when acutely suffered this.  Was noted on exam after years of no medical attention 07/2014.  Evaluated by [facility]  subsequently.  Marland Kitchen Hyperlipidemia   . Stutter age 7   cannot recall if something happened in life that set this off.  . Weight loss 02/12/2017  Past Surgical History:  Procedure Laterality Date  . hand surgery     thumb  . TONSILLECTOMY      No Known Allergies  Family History  Problem Relation Age of Onset  . Sickle cell anemia Mother   . Kidney disease Father   . Alcohol abuse Father   . Colon  cancer Neg Hx     Social History   Socioeconomic History  . Marital status: Single    Spouse name: Not on file  . Number of children: 0  . Years of education: 30  . Highest education level: 12th grade  Occupational History  . Occupation: Maintenance/Housekeeping    Employer: [employer]    Comment: 2016  Social Needs  . Financial resource strain: Somewhat hard  . Food insecurity:    Worry: Sometimes true    Inability: Sometimes true  . Transportation needs:    Medical: No    Non-medical: No  Tobacco Use  . Smoking status: Never Smoker  . Smokeless tobacco: Never Used  Substance and Sexual Activity  . Alcohol use: Yes    Alcohol/week: 0.0 standard drinks    Comment: Once a monthly  . Drug use: No  . Sexual activity: Yes    Partners: Female  Lifestyle  . Physical activity:    Days per week: 7 days    Minutes per session: 60 min  . Stress: Only a little  Relationships  . Social connections:    Talks on phone: Twice a week    Gets together: Once a week    Attends religious service: Never    Active member of club or organization: No    Attends meetings of clubs or organizations: Never    Relationship status: Never married  Other Topics Concern  . Not on file  Social History Narrative   Was living at a boarding house.   Patient was having difficulty affording food--reportedly because he lived in boarding house and did not have utilitiy bills, his EBT dollars were cut to $13 to $15 per month.   That has been changed.  He now receives adequate EBT dollars.      Now living at [facility].  This is for folks with a disability.  He has an apartment now with a kitchen.  The [place] near him set him up in these apartments that they support.     Grant Ruts [phone]      09/05/18: Pt. Still living at [facility] [address], likes it. Sometimes participates in events hosted by center there.   Lives alone, but sister local, and main source of support.   Enjoys  doing own workout routine with weights and walking. Enjoys reading.        Vitals:   09/12/18 0800  BP: 120/74  Pulse: 61  Resp: 16  Temp: 97.7 F (36.5 C)  SpO2: 97%   Body mass index is 22.92 kg/m.   Wt Readings from Last 3 Encounters:  09/12/18 183 lb 6 oz (83.2 kg)  09/05/18 183 lb (83 kg)  02/21/18 186 lb 8 oz (84.6 kg)    Physical Exam  Nursing note and vitals reviewed. Constitutional: He is oriented to person, place, and time. He appears well-developed. No distress.  HENT:  Head: Atraumatic.  Right Ear: External ear normal.  Left Ear: External ear normal.  Mouth/Throat: Oropharynx is clear and moist and mucous membranes are normal.  Cerumen excess bilateral,cannot appreciate TM's  Eyes: Pupils are equal, round, and reactive  to light. Conjunctivae and EOM are normal.  Neck: Normal range of motion. No tracheal deviation present. No thyromegaly present.  Cardiovascular: Normal rate. An irregular rhythm present.  No murmur heard. Pulses:      Dorsalis pedis pulses are 2+ on the right side and 2+ on the left side.  Intermittent irregularity HR during auscultation.  Respiratory: Effort normal and breath sounds normal. No respiratory distress.  GI: Soft. He exhibits no mass. There is no hepatomegaly. There is no abdominal tenderness.  Genitourinary:    Genitourinary Comments: Refused,no concerns.   Musculoskeletal:        General: No tenderness or edema.     Comments: No signs of synovitis.  Lymphadenopathy:    He has no cervical adenopathy.       Right: No supraclavicular adenopathy present.       Left: No supraclavicular adenopathy present.  Neurological: He is alert and oriented to person, place, and time. He has normal strength. No cranial nerve deficit (exept for right facial nerve territory/Bell's plasy). Coordination and gait (Stable gait with no assistance.) normal.  Reflex Scores:      Bicep reflexes are 2+ on the right side and 2+ on the left side.       Patellar reflexes are 2+ on the right side and 2+ on the left side. Skin: Skin is warm. No erythema.  Psychiatric: He has a normal mood and affect.  Well groomed,gooed eye contact.      ASSESSMENT AND PLAN:  Mr. SOLOMAN MCKEITHAN was here today annual physical examination.   Orders Placed This Encounter  Procedures  . Comprehensive metabolic panel  . Lipid panel  . PSA(Must document that pt has been informed of limitations of PSA testing.)  . EKG 12-Lead   Lab Results  Component Value Date   CREATININE 1.15 09/12/2018   BUN 17 09/12/2018   NA 140 09/12/2018   K 4.9 09/12/2018   CL 101 09/12/2018   CO2 30 09/12/2018   Lab Results  Component Value Date   CREATININE 1.15 09/12/2018   BUN 17 09/12/2018   NA 140 09/12/2018   K 4.9 09/12/2018   CL 101 09/12/2018   CO2 30 09/12/2018   Lab Results  Component Value Date   CHOL 227 (H) 09/12/2018   HDL 79.50 09/12/2018   LDLCALC 136 (H) 09/12/2018   TRIG 61.0 09/12/2018   CHOLHDL 3 09/12/2018   Lab Results  Component Value Date   PSA 0.91 09/12/2018    Routine general medical examination at a health care facility We discussed the importance of regular physical activity and healthy diet for prevention of chronic illness and/or complications. Preventive guidelines reviewed. Vaccination up-to-date. Fall prevention. Next CPE in a year.  Need for hepatitis C screening test -     Hepatitis C antibody  Irregular heart rate Asymptomatic. Sinus arrhythmia,normal axis and intervals. Compared with EKG 07/2014 no significant abnormalities. Clearly instructed about warning signs.  -     EKG 12-Lead  Prostate cancer screening -     PSA(Must document that pt has been informed of limitations of PSA testing.)  Hearing loss of right ear due to cerumen impaction Hearing loss right ear improved after ear lavage. Still cerumen excess, recommend OTC Debrox and avoidance of Q-tips. F/U as needed.   Hyperlipidemia No  changes in Zocor 20 mg daily. Continue low-fat diet. Further recommendation will be given according to lipid panel results. F/U in a year.  Return in 1 year (on 09/13/2019) for cpe and HLD.          Neysha Criado G. [place], Miguel Peters  [facility]. [place] office.

## 2018-09-12 ENCOUNTER — Ambulatory Visit (INDEPENDENT_AMBULATORY_CARE_PROVIDER_SITE_OTHER): Payer: Medicare Other | Admitting: Family Medicine

## 2018-09-12 ENCOUNTER — Encounter: Payer: Self-pay | Admitting: Family Medicine

## 2018-09-12 VITALS — BP 120/74 | HR 61 | Temp 97.7°F | Resp 16 | Ht 75.0 in | Wt 183.4 lb

## 2018-09-12 DIAGNOSIS — Z1159 Encounter for screening for other viral diseases: Secondary | ICD-10-CM

## 2018-09-12 DIAGNOSIS — E785 Hyperlipidemia, unspecified: Secondary | ICD-10-CM

## 2018-09-12 DIAGNOSIS — Z125 Encounter for screening for malignant neoplasm of prostate: Secondary | ICD-10-CM

## 2018-09-12 DIAGNOSIS — I499 Cardiac arrhythmia, unspecified: Secondary | ICD-10-CM

## 2018-09-12 DIAGNOSIS — Z0000 Encounter for general adult medical examination without abnormal findings: Secondary | ICD-10-CM | POA: Diagnosis not present

## 2018-09-12 DIAGNOSIS — H6121 Impacted cerumen, right ear: Secondary | ICD-10-CM

## 2018-09-12 LAB — COMPREHENSIVE METABOLIC PANEL
ALBUMIN: 4.6 g/dL (ref 3.5–5.2)
ALT: 11 U/L (ref 0–53)
AST: 13 U/L (ref 0–37)
Alkaline Phosphatase: 82 U/L (ref 39–117)
BUN: 17 mg/dL (ref 6–23)
CO2: 30 mEq/L (ref 19–32)
Calcium: 10.3 mg/dL (ref 8.4–10.5)
Chloride: 101 mEq/L (ref 96–112)
Creatinine, Ser: 1.15 mg/dL (ref 0.40–1.50)
GFR: 76.97 mL/min (ref >60.00)
Glucose, Bld: 89 mg/dL (ref 70–99)
Potassium: 4.9 mEq/L (ref 3.5–5.1)
SODIUM: 140 meq/L (ref 135–145)
Total Bilirubin: 0.7 mg/dL (ref 0.2–1.2)
Total Protein: 7.2 g/dL (ref 6.0–8.3)

## 2018-09-12 LAB — LIPID PANEL
Cholesterol: 227 mg/dL — ABNORMAL HIGH (ref 0–200)
HDL: 79.5 mg/dL (ref >39.00)
LDL Cholesterol: 136 mg/dL — ABNORMAL HIGH (ref 0–99)
NonHDL: 147.76
Total CHOL/HDL Ratio: 3
Triglycerides: 61 mg/dL (ref 0.0–149.0)
VLDL: 12.2 mg/dL (ref 0.0–40.0)

## 2018-09-12 LAB — PSA: PSA: 0.91 ng/mL (ref 0.10–4.00)

## 2018-09-12 NOTE — Patient Instructions (Addendum)
A few things to remember from today's visit:   Routine general medical examination at a health care facility  Hyperlipidemia, unspecified hyperlipidemia type - Plan: Comprehensive metabolic panel, Lipid panel  Need for hepatitis C screening test - Plan: Hepatitis C antibody  Irregular heart rate - Plan: EKG 12-Lead  Prostate cancer screening - Plan: PSA(Must document that pt has been informed of limitations of PSA testing.)  A few tips:  -As we age balance is not as good as it was, so there is a higher risks for falls. Please remove small rugs and furniture that is "in your way" and could increase the risk of falls. Stretching exercises may help with fall prevention: Yoga and Tai Chi are some examples. Low impact exercise is better, so you are not very achy the next day.  -Sun screen and avoidance of direct sun light recommended. Caution with dehydration, if working outdoors be sure to drink enough fluids.  - Some medications are not safe as we age, increases the risk of side effects and can potentially interact with other medication you are also taken;  including some of over the counter medications. Be sure to let me know when you start a new medication even if it is a dietary/vitamin supplement.   -Healthy diet low in red meet/animal fat and sugar + regular physical activity is recommended.      Please be sure medication list is accurate. If a new problem present, please set up appointment sooner than planned today.

## 2018-09-12 NOTE — Assessment & Plan Note (Signed)
No changes in Zocor 20 mg daily. Continue low-fat diet. Further recommendation will be given according to lipid panel results. F/U in a year.

## 2018-09-13 LAB — HEPATITIS C ANTIBODY
HEP C AB: NONREACTIVE
SIGNAL TO CUT-OFF: 0.03 (ref <1.00)

## 2018-10-21 DIAGNOSIS — L408 Other psoriasis: Secondary | ICD-10-CM | POA: Diagnosis not present

## 2018-11-30 ENCOUNTER — Other Ambulatory Visit: Payer: Self-pay

## 2018-11-30 ENCOUNTER — Ambulatory Visit (INDEPENDENT_AMBULATORY_CARE_PROVIDER_SITE_OTHER): Payer: Medicare Other | Admitting: Family Medicine

## 2018-11-30 ENCOUNTER — Encounter: Payer: Self-pay | Admitting: Family Medicine

## 2018-11-30 ENCOUNTER — Ambulatory Visit: Payer: Self-pay

## 2018-11-30 DIAGNOSIS — H02846 Edema of left eye, unspecified eyelid: Secondary | ICD-10-CM

## 2018-11-30 NOTE — Telephone Encounter (Signed)
PT scheduled for OV today.

## 2018-11-30 NOTE — Telephone Encounter (Signed)
Returned call to patient who states that he has swelling under his left eye.  He states that it has been there for a week. He denies injury. He denies drainage or crusting when he gets up in the AM. He states that he has no tooth pain. No sinus issues. He does not have a bug bite. It does not itch. He has no pain. The swelling only involves the left lower lid. He states he has no redness to the white area of his eye. Care advice read to patient. Pt verbalized understanding. Call was transferred to office for scheduling.  Reason for Disposition . [1] MILD eyelid swelling (puffiness) AND [2] persists > 3 days  (Exception: suspect mosquito bites)  Answer Assessment - Initial Assessment Questions 1. ONSET: "When did the swelling start?" (e.g., minutes, hours, days)     1 week 2. LOCATION: "What part of the eyelids is swollen?"     bottom 3. SEVERITY: "How swollen is it?"     Looks swollen 4. ITCHING: "Is there any itching?" If so, ask: "How much?"   (Scale 1-10; mild, moderate or severe)     None 5. PAIN: "Is the swelling painful to touch?" If so, ask: "How painful is it?"   (Scale 1-10; mild, moderate or severe)     None 6. FEVER: "Do you have a fever?" If so, ask: "What is it, how was it measured, and when did it start?"     No 7. CAUSE: "What do you think is causing the swelling?"     unsure 8. RECURRENT SYMPTOM: "Have you had eyelid swelling before?" If so, ask: "When was the last time?" "What happened that time?"     no 9. OTHER SYMPTOMS: "Do you have any other symptoms?" (e.g., blurred vision, eye discharge, rash, runny nose)     no 10. PREGNANCY: "Is there any chance you are pregnant?" "When was your last menstrual period?"    No  Protocols used: EYE - [facility]

## 2018-11-30 NOTE — Progress Notes (Signed)
Virtual Visit via Telephone Note  I connected with Miguel Peters on 11/30/18 at  3:30 PM EDT by telephone and verified that I am speaking with the correct person using two identifiers.   I discussed the limitations, risks, security and privacy concerns of performing an evaluation and management service by telephone and the availability of in person appointments. I also discussed with the patient that there may be a patient responsible charge related to this service. The patient expressed understanding and agreed to proceed.  Location patient: home Location provider: home office Participants present for the call: patient, provider Patient did not have a visit in the prior 7 days to address this/these issue(s).   History of Present Illness:  [name] is a 68 yo male with Hx of HLD,chronic back pain,and Hx of right-sided Bell's palsy concerned about localized edema under left eye,nasal angle and extending to proximal aspect of nose nose bridge. He has no pain or erythema on affected area. No pruritus.  Problem is constant and stable. He has not had similar problem before.  He denies chills, fever, fatigue, visual changes, conjunctival erythema or purulent drainage, nasal congestion, rhinorrhea, sore throat, oral lesions, dysphagia, or stridor.  He denies headache, facial pain/edema, numbness, tingling, focal deficit, nausea, or vomiting. Negative for chest pain, dyspnea, cough, wheezing, orthopnea, or PND. He has not noted upper extremities or lower extremities edema, gross hematuria, decreased urine output, or skin rash.  No new medication, insect bite, or new facial products.   He has not identified exacerbating factors. It is alleviated by local heat. + Epiphora, intermittently left eye.  He is not having any problem with right eye  Observations/Objective: Patient sounds cheerful and well on the phone. I do not appreciate any SOB. Speech and thought processing are grossly  intact. Patient reported vitals:N/A  Assessment and Plan:  1. Edema eyelid, left Difficult to determine etiology with no inspection but description of symptoms and Hx does not suggest a serious process.  ? Lacrimal duct obstruction. ?Allergic conjunctivitis.  Instructed to monitor for new symptoms. Continue local heat and may start applying local massage a few times during the day. He was clearly instructed about warning signs and he voices understanding, repeated back instructions.  Follow Up Instructions:  Follow up in 2-3 weeks if problem is persistent,before if worse. He may needed evaluation by his eye care provider.  I did not refer this patient for an OV in the next 24 hours for this/these issue(s).  I discussed the assessment and treatment plan with the patient.He was provided an opportunity to ask questions and all were answered. The patient agreed with the plan and demonstrated an understanding of the instructions.   The patient was advised to call back or seek an in-person evaluation if the symptoms worsen or if the condition fails to improve as anticipated.  I provided 22 minutes of non-face-to-face time during this encounter.   Miriam Liles [place], MD

## 2018-12-08 ENCOUNTER — Telehealth: Payer: Self-pay | Admitting: Family Medicine

## 2018-12-08 NOTE — Telephone Encounter (Signed)
Error

## 2018-12-09 ENCOUNTER — Ambulatory Visit (INDEPENDENT_AMBULATORY_CARE_PROVIDER_SITE_OTHER): Payer: Medicare Other | Admitting: Family Medicine

## 2018-12-09 ENCOUNTER — Other Ambulatory Visit: Payer: Self-pay

## 2018-12-09 ENCOUNTER — Encounter: Payer: Self-pay | Admitting: Family Medicine

## 2018-12-09 ENCOUNTER — Ambulatory Visit (INDEPENDENT_AMBULATORY_CARE_PROVIDER_SITE_OTHER): Payer: Medicare Other

## 2018-12-09 VITALS — BP 120/70 | HR 70 | Temp 98.1°F | Resp 12 | Ht 75.0 in | Wt 193.0 lb

## 2018-12-09 DIAGNOSIS — H02846 Edema of left eye, unspecified eyelid: Secondary | ICD-10-CM | POA: Diagnosis not present

## 2018-12-09 DIAGNOSIS — R600 Localized edema: Secondary | ICD-10-CM

## 2018-12-09 LAB — CBC
HCT: 45.6 % (ref 39.0–52.0)
Hemoglobin: 15.4 g/dL (ref 13.0–17.0)
MCHC: 33.8 g/dL (ref 30.0–36.0)
MCV: 87.7 fl (ref 78.0–100.0)
Platelets: 194 10*3/uL (ref 150.0–400.0)
RBC: 5.2 Mil/uL (ref 4.22–5.81)
RDW: 16.5 % — ABNORMAL HIGH (ref 11.5–15.5)
WBC: 4.8 10*3/uL (ref 4.0–10.5)

## 2018-12-09 LAB — BASIC METABOLIC PANEL
BUN: 15 mg/dL (ref 6–23)
CO2: 27 mEq/L (ref 19–32)
Calcium: 9.6 mg/dL (ref 8.4–10.5)
Chloride: 102 mEq/L (ref 96–112)
Creatinine, Ser: 1.13 mg/dL (ref 0.40–1.50)
GFR: 78.49 mL/min (ref >60.00)
Glucose, Bld: 83 mg/dL (ref 70–99)
Potassium: 4.4 mEq/L (ref 3.5–5.1)
Sodium: 139 mEq/L (ref 135–145)

## 2018-12-09 NOTE — Progress Notes (Signed)
HPI:   Miguel Peters is a 68 y.o. male, who is here today to follow on recent virtual tele visit on 11/30/18.  I recommend local massage and warm compresses,thinking problem was localized on nasal aspect, ?  Lacrimal duct obstruction. Problem is affecting lower eye lid,minimsl on upper eye lid.  Eyelid edema that has been going on for about a month. Problem is constant.  It seems to be slightly better today.  Negative for fever, changes in appetite, chills, visual changes, eye drainage, conjunctival erythema, nasal congestion, rhinorrhea, oral lesions, sore throat, cough, dyspnea, wheezing, or new focal deficit. He has history of right side Bell's palsy.   Review of Systems  Constitutional: Negative for activity change and appetite change.  HENT: Positive for facial swelling. Negative for sinus pain.   Eyes: Negative for redness and itching.  Respiratory: Negative for stridor.   Cardiovascular: Negative for palpitations and leg swelling.  Genitourinary: Negative for decreased urine volume, dysuria and hematuria.  Musculoskeletal: Negative for joint swelling, myalgias and neck pain.  Skin: Negative for pallor and rash.  Neurological: Negative for headaches.  Hematological: Negative for adenopathy.    Current Outpatient Medications on File Prior to Visit  Medication Sig Dispense Refill  . aspirin 81 MG tablet Take 81 mg by mouth daily.    . calcipotriene (DOVONOX) 0.005 % cream APP TOPICALLY TO GROIN AND BUTTOCKS BID    . simvastatin (ZOCOR) 20 MG tablet Take 1 tablet (20 mg total) by mouth at bedtime. 90 tablet 3  . triamcinolone cream (KENALOG) 0.1 % Apply 1 application topically 2 (two) times daily as needed. 30 g 2   No current facility-administered medications on file prior to visit.      Past Medical History:  Diagnosis Date  . Arthritis   . Bell's palsy 2016   Right sided.  Not clear when acutely suffered this.  Was noted on exam after years of no  medical attention 07/2014.  Evaluated by [facility]  subsequently.  Marland Kitchen Hyperlipidemia   . Stutter age 21   cannot recall if something happened in life that set this off.  . Weight loss 02/12/2017   No Known Allergies  Social History   Socioeconomic History  . Marital status: Single    Spouse name: Not on file  . Number of children: 0  . Years of education: 66  . Highest education level: 12th grade  Occupational History  . Occupation: Maintenance/Housekeeping    Employer: [employer]    Comment: 2016  Social Needs  . Financial resource strain: Somewhat hard  . Food insecurity:    Worry: Sometimes true    Inability: Sometimes true  . Transportation needs:    Medical: No    Non-medical: No  Tobacco Use  . Smoking status: Never Smoker  . Smokeless tobacco: Never Used  Substance and Sexual Activity  . Alcohol use: Yes    Alcohol/week: 0.0 standard drinks    Comment: Once a monthly  . Drug use: No  . Sexual activity: Yes    Partners: Female  Lifestyle  . Physical activity:    Days per week: 7 days    Minutes per session: 60 min  . Stress: Only a little  Relationships  . Social connections:    Talks on phone: Twice a week    Gets together: Once a week    Attends religious service: Never    Active member of club or organization: No  Attends meetings of clubs or organizations: Never    Relationship status: Never married  Other Topics Concern  . Not on file  Social History Narrative   Was living at a boarding house.   Patient was having difficulty affording food--reportedly because he lived in boarding house and did not have utilitiy bills, his EBT dollars were cut to $13 to $15 per month.   That has been changed.  He now receives adequate EBT dollars.      Now living at [facility].  This is for folks with a disability.  He has an apartment now with a kitchen.  The [place] near him set him up in these apartments that they support.     Grant Ruts [phone]      09/05/18: Pt. Still living at [facility] [address], likes it. Sometimes participates in events hosted by center there.   Lives alone, but sister local, and main source of support.   Enjoys doing own workout routine with weights and walking. Enjoys reading.       Vitals:   12/09/18 0811  BP: 120/70  Pulse: 70  Resp: 12  Temp: 98.1 F (36.7 C)  SpO2: 96%   Body mass index is 24.12 kg/m.  Physical Exam  Nursing note and vitals reviewed. Constitutional: He is oriented to person, place, and time. He appears well-developed and well-nourished. No distress.  HENT:  Head: Normocephalic and atraumatic.  Mouth/Throat: Oropharynx is clear and moist and mucous membranes are normal.  Eyes: Pupils are equal, round, and reactive to light. Conjunctivae and EOM are normal.  Left lower eye lid edema,soft,no erythema.Mild edema upper left cheek.  Cardiovascular: Normal rate and regular rhythm.  No murmur heard. Respiratory: Effort normal and breath sounds normal. No respiratory distress.  GI: Soft. He exhibits no mass. There is no hepatomegaly. There is no abdominal tenderness.  Musculoskeletal:        General: No edema.  Lymphadenopathy:       Head (left side): No preauricular and no posterior auricular adenopathy present.    He has no cervical adenopathy.  Neurological: He is alert and oriented to person, place, and time. He has normal strength.  No focal deficit except for right 5th palsy residual deficit.  Skin: Skin is warm. No rash noted. No erythema.  Psychiatric: He has a normal mood and affect.  Well groomed, good eye contact.    ASSESSMENT AND PLAN:  Miguel Peters was seen today for left eye edema.  Orders Placed This Encounter  Procedures  . DG Chest 2 View  . CBC  . Basic metabolic panel   Lab Results  Component Value Date   CREATININE 1.13 12/09/2018   BUN 15 12/09/2018   NA 139 12/09/2018   K 4.4 12/09/2018   CL 102 12/09/2018   CO2 27 12/09/2018    Lab Results  Component Value Date   WBC 4.8 12/09/2018   HGB 15.4 12/09/2018   HCT 45.6 12/09/2018   MCV 87.7 12/09/2018   PLT 194.0 12/09/2018    Facial edema Edema is mainly on the left eyelid but he also has some around left cheek. We discussed possible etiologies. Examination today and history do not suggest a serious process. For now recommend cold compresses, teabags. Monitor for new symptoms. Further recommendation will be given according to lab results.   Return if symptoms worsen or fail to improve.   Adalae Baysinger G. [place], MD  [facility]. [place] office.

## 2018-12-09 NOTE — Patient Instructions (Addendum)
A few things to remember from today's visit:   Facial edema - Plan: CBC, Basic metabolic panel, DG Chest 2 View Left peri ocular  Cold compresses as discussed. Monitor for fever, worsening symptoms, visual changes, oral lesions, respiratory symptoms.  If problem is persistent we may need to consider evaluation by eye care provider.  Please be sure medication list is accurate. If a new problem present, please set up appointment sooner than planned today.

## 2019-01-16 DIAGNOSIS — L408 Other psoriasis: Secondary | ICD-10-CM | POA: Diagnosis not present

## 2019-04-26 ENCOUNTER — Telehealth: Payer: Self-pay | Admitting: Family Medicine

## 2019-04-26 ENCOUNTER — Other Ambulatory Visit: Payer: Self-pay | Admitting: *Deleted

## 2019-04-26 MED ORDER — SIMVASTATIN 20 MG PO TABS: 20.0000 mg | ORAL_TABLET | Freq: Every day | ORAL | 3 refills | Status: DC

## 2019-04-26 NOTE — Telephone Encounter (Signed)
Rx sent to pharmacy as requested.

## 2019-04-26 NOTE — Telephone Encounter (Signed)
rx refill simvastatin (ZOCOR) 20 MG tablet  PHARMACY [facility] [id number], [address] AT [place] [phone] (Phone) [phone] (Fax)

## 2019-09-12 ENCOUNTER — Other Ambulatory Visit: Payer: Self-pay

## 2019-09-13 ENCOUNTER — Encounter: Payer: Self-pay | Admitting: Family Medicine

## 2019-09-13 ENCOUNTER — Ambulatory Visit (INDEPENDENT_AMBULATORY_CARE_PROVIDER_SITE_OTHER): Payer: Medicare Other | Admitting: Family Medicine

## 2019-09-13 VITALS — BP 112/80 | HR 92 | Temp 96.3°F | Resp 16 | Ht 75.0 in | Wt 200.0 lb

## 2019-09-13 DIAGNOSIS — N401 Enlarged prostate with lower urinary tract symptoms: Secondary | ICD-10-CM

## 2019-09-13 DIAGNOSIS — E785 Hyperlipidemia, unspecified: Secondary | ICD-10-CM

## 2019-09-13 DIAGNOSIS — R351 Nocturia: Secondary | ICD-10-CM | POA: Diagnosis not present

## 2019-09-13 DIAGNOSIS — Z0000 Encounter for general adult medical examination without abnormal findings: Secondary | ICD-10-CM

## 2019-09-13 DIAGNOSIS — N529 Male erectile dysfunction, unspecified: Secondary | ICD-10-CM

## 2019-09-13 LAB — LIPID PANEL
Cholesterol: 211 mg/dL — ABNORMAL HIGH (ref 0–200)
HDL: 50.8 mg/dL (ref >39.00)
LDL Cholesterol: 142 mg/dL — ABNORMAL HIGH (ref 0–99)
NonHDL: 160.45
Total CHOL/HDL Ratio: 4
Triglycerides: 91 mg/dL (ref 0.0–149.0)
VLDL: 18.2 mg/dL (ref 0.0–40.0)

## 2019-09-13 LAB — COMPREHENSIVE METABOLIC PANEL
ALT: 20 U/L (ref 0–53)
AST: 16 U/L (ref 0–37)
Albumin: 4.4 g/dL (ref 3.5–5.2)
Alkaline Phosphatase: 98 U/L (ref 39–117)
BUN: 10 mg/dL (ref 6–23)
CO2: 29 mEq/L (ref 19–32)
Calcium: 9.6 mg/dL (ref 8.4–10.5)
Chloride: 104 mEq/L (ref 96–112)
Creatinine, Ser: 1.08 mg/dL (ref 0.40–1.50)
GFR: 82.51 mL/min (ref >60.00)
Glucose, Bld: 92 mg/dL (ref 70–99)
Potassium: 4.3 mEq/L (ref 3.5–5.1)
Sodium: 139 mEq/L (ref 135–145)
Total Bilirubin: 0.7 mg/dL (ref 0.2–1.2)
Total Protein: 7 g/dL (ref 6.0–8.3)

## 2019-09-13 LAB — URINALYSIS, ROUTINE W REFLEX MICROSCOPIC
Bilirubin Urine: NEGATIVE
Hgb urine dipstick: NEGATIVE
Ketones, ur: NEGATIVE
Leukocytes,Ua: NEGATIVE
Nitrite: NEGATIVE
RBC / HPF: NONE SEEN (ref 0–?)
Specific Gravity, Urine: 1.025 (ref 1.000–1.030)
Total Protein, Urine: NEGATIVE
Urine Glucose: NEGATIVE
Urobilinogen, UA: 0.2 (ref 0.0–1.0)
pH: 5.5 (ref 5.0–8.0)

## 2019-09-13 LAB — PSA: PSA: 0.96 ng/mL (ref 0.10–4.00)

## 2019-09-13 LAB — TSH: TSH: 2.58 u[IU]/mL (ref 0.35–4.50)

## 2019-09-13 NOTE — Progress Notes (Signed)
HPI:  [name] is a 69 y.o.male here today for his routine physical examination.  Last CPE: 09/12/18. No new problems sine his last visit. He lives alone. He still keeps in touch with his sister. Regular exercise 3 or more times per week: Not as frequent as last year before COVID 19 pandemia started.Walking about 2 times per week Following a healthful diet: In general he has but eating more for the past few months. He has gained some wt.  Chronic medical problems: Bell's palsy,HLD,OA among some.  Immunization History  Administered Date(s) Administered  . Fluad Quad(high Dose 65+) 04/28/2019  . Influenza-Unspecified 06/26/2018  . Pneumococcal Conjugate-13 09/05/2018  . Tdap 06/22/2017    -Hep C screening: 09/12/18 NR.Last colon cancer screening: 10/05/2014, 5 years follow up was recommended. Last prostate ca screening: PSA 0.91 in 08/2018. Nocturia x 2.  -Denies high alcohol intake, tobacco use, or Hx of illicit drug use.  -Concerns and/or follow up today:   C/O ED for about 2 weeks. Intermittent. + Morning erections. He has not noted anatomical abnormalities.  He has not tried OTC medications.   Lab Results  Component Value Date   TSH 3.360 01/08/2017   HLD: Currently he is on Simvastatin 20 mg daily. Tolerating medication well.  Lab Results  Component Value Date   CHOL 227 (H) 09/12/2018   HDL 79.50 09/12/2018   LDLCALC 136 (H) 09/12/2018   TRIG 61.0 09/12/2018   CHOLHDL 3 09/12/2018   Lab Results  Component Value Date   CREATININE 1.13 12/09/2018   BUN 15 12/09/2018   NA 139 12/09/2018   K 4.4 12/09/2018   CL 102 12/09/2018   CO2 27 12/09/2018    Review of Systems  Constitutional: Negative for activity change, appetite change, fatigue and fever.  HENT: Negative for dental problem, nosebleeds and sore throat.   Eyes: Negative for redness and visual disturbance.  Respiratory: Negative for cough, shortness of breath and wheezing.     Cardiovascular: Negative for chest pain, palpitations and leg swelling.  Gastrointestinal: Negative for abdominal pain, blood in stool, nausea and vomiting.  Endocrine: Negative for cold intolerance, heat intolerance, polydipsia, polyphagia and polyuria.  Genitourinary: Negative for decreased urine volume, dysuria, genital sores, hematuria, penile swelling, scrotal swelling and testicular pain.  Musculoskeletal: Negative for arthralgias, back pain, joint swelling and myalgias.  Skin: Negative for color change and rash.  Allergic/Immunologic: Positive for environmental allergies.  Neurological: Negative for syncope, weakness and headaches.  Hematological: Negative for adenopathy. Does not bruise/bleed easily.  Psychiatric/Behavioral: Negative for confusion and sleep disturbance. The patient is nervous/anxious.   All other systems reviewed and are negative.   Current Outpatient Medications on File Prior to Visit  Medication Sig Dispense Refill  . aspirin 81 MG tablet Take 81 mg by mouth daily.    . calcipotriene (DOVONOX) 0.005 % cream APP TOPICALLY TO GROIN AND BUTTOCKS BID    . simvastatin (ZOCOR) 20 MG tablet Take 1 tablet (20 mg total) by mouth at bedtime. 90 tablet 3  . triamcinolone cream (KENALOG) 0.1 % Apply 1 application topically 2 (two) times daily as needed. 30 g 2   No current facility-administered medications on file prior to visit.   Past Medical History:  Diagnosis Date  . Arthritis   . Bell's palsy 2016   Right sided.  Not clear when acutely suffered this.  Was noted on exam after years of no medical attention 07/2014.  Evaluated by [facility]  subsequently.  Marland Kitchen Hyperlipidemia   . Stutter age 30   cannot recall if something happened in life that set this off.  . Weight loss 02/12/2017    Past Surgical History:  Procedure Laterality Date  . hand surgery     thumb  . TONSILLECTOMY      No Known Allergies  Family History  Problem Relation Age of Onset   . Sickle cell anemia Mother   . Kidney disease Father   . Alcohol abuse Father   . Colon cancer Neg Hx     Social History   Socioeconomic History  . Marital status: Single    Spouse name: Not on file  . Number of children: 0  . Years of education: 35  . Highest education level: 12th grade  Occupational History  . Occupation: Maintenance/Housekeeping    Employer: [employer]    Comment: 2016  Tobacco Use  . Smoking status: Never Smoker  . Smokeless tobacco: Never Used  Substance and Sexual Activity  . Alcohol use: Yes    Alcohol/week: 0.0 standard drinks    Comment: Once a monthly  . Drug use: No  . Sexual activity: Yes    Partners: Female  Other Topics Concern  . Not on file  Social History Narrative   Was living at a boarding house.   Patient was having difficulty affording food--reportedly because he lived in boarding house and did not have utilitiy bills, his EBT dollars were cut to $13 to $15 per month.   That has been changed.  He now receives adequate EBT dollars.      Now living at [facility].  This is for folks with a disability.  He has an apartment now with a kitchen.  The [employer] near him set him up in these apartments that they support.     Grant Ruts [phone]      09/05/18: Pt. Still living at [facility] [address], likes it. Sometimes participates in events hosted by center there.   Lives alone, but sister local, and main source of support.   Enjoys doing own workout routine with weights and walking. Enjoys reading.      Social Determinants of Health   Financial Resource Strain:   . Difficulty of Paying Living Expenses: Not on file  Food Insecurity:   . Worried About Charity fundraiser in the Last Year: Not on file  . Ran Out of Food in the Last Year: Not on file  Transportation Needs:   . Lack of Transportation (Medical): Not on file  . Lack of Transportation (Non-Medical): Not on file  Physical Activity:   . Days of Exercise per  Week: Not on file  . Minutes of Exercise per Session: Not on file  Stress:   . Feeling of Stress : Not on file  Social Connections:   . Frequency of Communication with Friends and Family: Not on file  . Frequency of Social Gatherings with Friends and Family: Not on file  . Attends Religious Services: Not on file  . Active Member of Clubs or Organizations: Not on file  . Attends Archivist Meetings: Not on file  . Marital Status: Not on file    Vitals:   09/13/19 0752  BP: 112/80  Pulse: 92  Resp: 16  Temp: (!) 96.3 F (35.7 C)  SpO2: 97%   Body mass index is 25 kg/m.   Wt Readings from Last 3 Encounters:  09/13/19 200 lb (90.7 kg)  12/09/18 193 lb (87.5 kg)  09/12/18 183 lb 6 oz (83.2 kg)    Physical Exam  Nursing note and vitals reviewed. Constitutional: He is oriented to person, place, and time. He appears well-developed and well-nourished. No distress.  HENT:  Head: Normocephalic and atraumatic.  Right Ear: Tympanic membrane, external ear and ear canal normal.  Left Ear: Tympanic membrane, external ear and ear canal normal.  Mouth/Throat: Oropharynx is clear and moist and mucous membranes are normal.  Eyes: Pupils are equal, round, and reactive to light. Conjunctivae are normal.  Cardiovascular: Normal rate and regular rhythm.  No murmur heard. Pulses:      Dorsalis pedis pulses are 2+ on the right side and 2+ on the left side.  Respiratory: Effort normal and breath sounds normal. No respiratory distress.  GI: Soft. He exhibits no mass. There is no hepatomegaly. There is no abdominal tenderness.  Genitourinary: Rectum:     No rectal mass, tenderness or abnormal anal tone.  Prostate is enlarged. Prostate is not tender.  Musculoskeletal:        General: No tenderness or edema.     Cervical back: Normal range of motion.     Comments: No signs of synovitis.  Lymphadenopathy:    He has no cervical adenopathy.       Right: No supraclavicular adenopathy  present.       Left: No supraclavicular adenopathy present.  Neurological: He is alert and oriented to person, place, and time. He has normal strength.  Reflex Scores:      Bicep reflexes are 2+ on the right side and 2+ on the left side.      Patellar reflexes are 2+ on the right side and 2+ on the left side. No focal deficit appreciated except for right facial palsy. Otherwise stable gait, it is not assisted.  Skin: Skin is warm. No erythema.  Psychiatric: He has a normal mood and affect.  Well groomed , good eye contact. Stutter speech.    ASSESSMENT AND PLAN:  Mr. Yavier was seen today for annual exam.  Diagnoses and all orders for this visit:  Orders Placed This Encounter  Procedures  . Comprehensive metabolic panel  . TSH  . PSA(Must document that pt has been informed of limitations of PSA testing.)  . Lipid panel  . Urinalysis, Routine w reflex microscopic   Lab Results  Component Value Date   CHOL 211 (H) 09/13/2019   HDL 50.80 09/13/2019   LDLCALC 142 (H) 09/13/2019   TRIG 91.0 09/13/2019   CHOLHDL 4 09/13/2019   Lab Results  Component Value Date   TSH 2.58 09/13/2019   Lab Results  Component Value Date   CREATININE 1.08 09/13/2019   BUN 10 09/13/2019   NA 139 09/13/2019   K 4.3 09/13/2019   CL 104 09/13/2019   CO2 29 09/13/2019   Lab Results  Component Value Date   ALT 20 09/13/2019   AST 16 09/13/2019   ALKPHOS 98 09/13/2019   BILITOT 0.7 09/13/2019          PSA 0.96 09/13/2019    Routine general medical examination at a health care facility We discussed the importance of regular physical activity and healthy diet for prevention of chronic illness and/or complications. Preventive guidelines reviewed. Vaccination: He is due for pneumovax but he received 2nd dose of COVID 19 5 days ago.Nurse visit in 2-3 weeks. Side effects of Aspirin discussed. Next CPE in a year.  Hyperlipidemia, unspecified hyperlipidemia type Continue Simvastatin  20 mg  daily and low fat diet. We will follow labs done today and will give further recommendations accordingly.  Erectile dysfunction, unspecified erectile dysfunction type Possible etiologies discussed. For now we will hold on pharmacologic treatment. We discussed some options as well as possible side effects.  BPH associated with nocturia Educated about Dx,prognosis,and treatment options. He is not very symptomatic, so we will hold on phar,acologic treatment.   Return in 1 year (on 09/12/2020) for Needs medicare visit.Marland Kitchen    Audelia Knape G. Martinique, MD  [facility]. [place] office.

## 2019-09-13 NOTE — Patient Instructions (Addendum)
A few things to remember from today's visit:   Routine general medical examination at a health care facility  Hyperlipidemia, unspecified hyperlipidemia type - Plan: Comprehensive metabolic panel, Lipid panel  Erectile dysfunction, unspecified erectile dysfunction type - Plan: TSH  BPH associated with nocturia - Plan: PSA(Must document that pt has been informed of limitations of PSA testing.)   At least 150 minutes of moderate exercise per week, daily brisk walking for 15-30 min is a good exercise option. Healthy diet low in saturated (animal) fats and sweets and consisting of fresh fruits and vegetables, lean meats such as fish and white chicken and whole grains.  - Vaccines:  Tdap vaccine every 10 years.  Shingles vaccine recommended at age 36, could be given after 67 years of age but not sure about insurance coverage.  Pneumonia vaccines:  Pneumovax at 11.Please arrange nurse visit in 2 weeks   -Screening recommendations for low/normal risk males:  Screening for diabetes at age 26 and every 3 years. Earlier screening if cardiovascular risk factors.   Lipid screening N/A  Colon cancer screening at age 60 and until age 79.You are due in 09/2019  Prostate cancer screening: some controversy, starts usually at 50: Rectal exam and PSA.  Aortic Abdominal Aneurism once between 69 and 53 years old if ever smoker.  Also recommended:  1. Dental visit- Brush and floss your teeth twice daily; visit your dentist twice a year. 2. Eye doctor- Get an eye exam at least every 2 years. 3. Helmet use- Always wear a helmet when riding a bicycle, motorcycle, rollerblading or skateboarding. 4. Safe sex- If you may be exposed to sexually transmitted infections, use a condom. 5. Seat belts- Seat belts can save your live; always wear one. 6. Smoke/Carbon Monoxide detectors- These detectors need to be installed on the appropriate level of your home. Replace batteries at least once a year. 7. Skin  cancer- When out in the sun please cover up and use sunscreen 15 SPF or higher. 8. Violence- If anyone is threatening or hurting you, please tell your healthcare provider.  9. Drink alcohol in moderation- Limit alcohol intake to one drink or less per day. Never drink and drive.  Please be sure medication list is accurate. If a new problem present, please set up appointment sooner than planned today.

## 2019-09-27 ENCOUNTER — Other Ambulatory Visit: Payer: Self-pay

## 2019-09-28 ENCOUNTER — Ambulatory Visit (INDEPENDENT_AMBULATORY_CARE_PROVIDER_SITE_OTHER): Payer: Medicare Other

## 2019-09-28 DIAGNOSIS — Z23 Encounter for immunization: Secondary | ICD-10-CM | POA: Diagnosis not present

## 2019-09-28 NOTE — Progress Notes (Signed)
Per orders of Dr. Volanda Napoleon, injection of pneumonia given by Franco Collet. Patient tolerated injection well.  Please sign in the absence of Dr.Jordan

## 2019-10-03 ENCOUNTER — Other Ambulatory Visit: Payer: Self-pay

## 2019-10-04 ENCOUNTER — Encounter: Payer: Self-pay | Admitting: Family Medicine

## 2019-10-04 ENCOUNTER — Ambulatory Visit (INDEPENDENT_AMBULATORY_CARE_PROVIDER_SITE_OTHER): Payer: Medicare Other | Admitting: Family Medicine

## 2019-10-04 VITALS — BP 126/70 | HR 86 | Resp 16 | Ht 75.0 in | Wt 199.5 lb

## 2019-10-04 DIAGNOSIS — S3994XA Unspecified injury of external genitals, initial encounter: Secondary | ICD-10-CM | POA: Diagnosis not present

## 2019-10-04 NOTE — Progress Notes (Signed)
ACUTE VISIT   HPI:  Chief Complaint  Patient presents with  . groin issues    Miguel Peters is a 69 y.o. male, who is here today concerned about episode of genital bleeding when he was using a towel to dry area. On 09/29/19 after shower he passed towel from back to front, noted red blood on towel and small skin tear on scrotum. Lesion has healed but he wants to be sure it is not something serious.  This happened a day after pneumovax,wonders if it may be a side effect.  He has not noted nose/gum bleeding, easier bruising,gross hematuria,melena,or blood in stool.  Negative for fever,night sweats,cough,SOB,CP,abdominal pain,N/V,dysuria,urethal discharge,tenticular masses,genital lesions,or skin rash.  Review of Systems  Constitutional: Negative for activity change, appetite change and fatigue.  HENT: Negative for mouth sores and nosebleeds.   Cardiovascular: Negative for leg swelling.  Genitourinary: Negative for flank pain, penile pain and testicular pain.  Neurological: Negative for weakness and headaches.  Rest see pertinent positives and negatives per HPI.  Current Outpatient Medications on File Prior to Visit  Medication Sig Dispense Refill  . aspirin 81 MG tablet Take 81 mg by mouth daily.    . calcipotriene (DOVONOX) 0.005 % cream APP TOPICALLY TO GROIN AND BUTTOCKS BID    . simvastatin (ZOCOR) 20 MG tablet Take 1 tablet (20 mg total) by mouth at bedtime. 90 tablet 3  . triamcinolone cream (KENALOG) 0.1 % Apply 1 application topically 2 (two) times daily as needed. 30 g 2   No current facility-administered medications on file prior to visit.   Past Medical History:  Diagnosis Date  . Arthritis   . Bell's palsy 2016   Right sided.  Not clear when acutely suffered this.  Was noted on exam after years of no medical attention 07/2014.  Evaluated by [facility]  subsequently.  Marland Kitchen Hyperlipidemia   . Stutter age 26   cannot recall if something  happened in life that set this off.  . Weight loss 02/12/2017   No Known Allergies  Social History   Socioeconomic History  . Marital status: Single    Spouse name: Not on file  . Number of children: 0  . Years of education: 10  . Highest education level: 12th grade  Occupational History  . Occupation: Maintenance/Housekeeping    Employer: [employer]    Comment: 2016  Tobacco Use  . Smoking status: Never Smoker  . Smokeless tobacco: Never Used  Substance and Sexual Activity  . Alcohol use: Yes    Alcohol/week: 0.0 standard drinks    Comment: Once a monthly  . Drug use: No  . Sexual activity: Yes    Partners: Female  Other Topics Concern  . Not on file  Social History Narrative   Was living at a boarding house.   Patient was having difficulty affording food--reportedly because he lived in boarding house and did not have utilitiy bills, his EBT dollars were cut to $13 to $15 per month.   That has been changed.  He now receives adequate EBT dollars.      Now living at [facility].  This is for folks with a disability.  He has an apartment now with a kitchen.  The [employer] near him set him up in these apartments that they support.     Grant Ruts [phone]      09/05/18: Pt. Still living at [facility], likes it. Sometimes participates in events  hosted by center there.   Lives alone, but sister local, and main source of support.   Enjoys doing own workout routine with weights and walking. Enjoys reading.      Social Determinants of Health   Financial Resource Strain:   . Difficulty of Paying Living Expenses:   Food Insecurity:   . Worried About Charity fundraiser in the Last Year:   . Arboriculturist in the Last Year:   Transportation Needs:   . Film/video editor (Medical):   Marland Kitchen Lack of Transportation (Non-Medical):   Physical Activity:   . Days of Exercise per Week:   . Minutes of Exercise per Session:   Stress:   . Feeling of Stress :     Social Connections:   . Frequency of Communication with Friends and Family:   . Frequency of Social Gatherings with Friends and Family:   . Attends Religious Services:   . Active Member of Clubs or Organizations:   . Attends Archivist Meetings:   Marland Kitchen Marital Status:     Vitals:   10/04/19 1355  BP: 126/70  Pulse: 86  Resp: 16  SpO2: 98%   Body mass index is 24.94 kg/m.  Physical Exam  Nursing note and vitals reviewed. Constitutional: He is oriented to person, place, and time. He appears well-developed and well-nourished. No distress.  HENT:  Head: Normocephalic and atraumatic.  Eyes: Conjunctivae are normal.  Cardiovascular: Normal rate and regular rhythm.  No murmur heard. Respiratory: Effort normal and breath sounds normal. No respiratory distress.  GI: Soft.  Genitourinary: Right testis shows no mass, no swelling and no tenderness. Left testis shows no mass, no swelling and no tenderness. No penile erythema or penile tenderness. No discharge found.    Genitourinary Comments: Healed linear tear in middle of scrotum, around 2 cm. There is no tenderness,erythema,or induration.   Lymphadenopathy:       Right: No inguinal adenopathy present.       Left: No inguinal adenopathy present.  Neurological: He is alert and oriented to person, place, and time. He has normal strength.  Mildly unstable gait.  Skin: Skin is warm. No erythema.  Psychiatric: His mood appears anxious.  Well groomed,good eye contact.   ASSESSMENT AND PLAN:   Miguel Peters was seen today for groin issues.  Diagnoses and all orders for this visit:  Injury to scrotum, initial encounter   Small skin tear already healed. No other associated findings,so reassured. Monitor for new symptoms. F/U as needed.   Return if symptoms worsen or fail to improve.    Maleeka Sabatino G. Martinique, MD  [facility]. [place] office.

## 2019-10-04 NOTE — Patient Instructions (Signed)
A few things to remember from today's visit:   Skin tear of elbow without complication, unspecified laterality, initial encounter  It has healed. Monitor for recurrent bleeding. Keep skin moisturized.  Please be sure medication list is accurate. If a new problem present, please set up appointment sooner than planned today.

## 2019-11-09 ENCOUNTER — Ambulatory Visit (AMBULATORY_SURGERY_CENTER): Payer: Self-pay | Admitting: *Deleted

## 2019-11-09 ENCOUNTER — Other Ambulatory Visit: Payer: Self-pay

## 2019-11-09 VITALS — Temp 96.2°F | Ht 75.0 in | Wt 204.4 lb

## 2019-11-09 DIAGNOSIS — Z86010 Personal history of colonic polyps: Secondary | ICD-10-CM

## 2019-11-09 NOTE — Progress Notes (Signed)
2nd of covid vaccine greater than 2 weeks ago  Pt is aware that care partner will wait in the car during procedure; if they feel like they will be too hot or cold to wait in the car; they may wait in the 4 th floor lobby. Patient is aware to bring only one care partner. We want them to wear a mask (we do not have any that we can provide them), practice social distancing, and we will check their temperatures when they get here.  I did remind the patient that their care partner needs to stay in the parking lot the entire time and have a cell phone available, we will call them when the pt is ready for discharge. Patient will wear mask into building.  No anesthesia problems per pt, no trouble moving neck   No egg or soy allergy  No home oxygen use   No medications for weight loss taken  emmi information given  Pt denies constipation issues  Pt reminded several times that he MUST have a care partner with him at his procedure- understanding voiced

## 2019-11-20 ENCOUNTER — Telehealth: Payer: Self-pay | Admitting: Gastroenterology

## 2019-11-20 NOTE — Telephone Encounter (Signed)
Hey Dr. Loletha Carrow- this patient called and cancelled his colonoscopy for 4/29- he states that he does not have transportation and does not have a care partner. He will call back to reschedule hes procedure.

## 2019-11-23 ENCOUNTER — Encounter: Payer: Medicare Other | Admitting: Gastroenterology

## 2020-01-11 ENCOUNTER — Ambulatory Visit (AMBULATORY_SURGERY_CENTER): Payer: Self-pay

## 2020-01-11 ENCOUNTER — Other Ambulatory Visit: Payer: Self-pay

## 2020-01-11 VITALS — Ht 75.0 in | Wt 202.0 lb

## 2020-01-11 DIAGNOSIS — Z86010 Personal history of colonic polyps: Secondary | ICD-10-CM

## 2020-01-11 NOTE — Progress Notes (Signed)

## 2020-01-25 ENCOUNTER — Other Ambulatory Visit: Payer: Self-pay

## 2020-01-25 ENCOUNTER — Ambulatory Visit (AMBULATORY_SURGERY_CENTER): Payer: Medicare Other | Admitting: Gastroenterology

## 2020-01-25 ENCOUNTER — Encounter: Payer: Self-pay | Admitting: Gastroenterology

## 2020-01-25 VITALS — BP 109/57 | HR 76 | Temp 97.1°F | Resp 14 | Ht 75.0 in | Wt 202.0 lb

## 2020-01-25 DIAGNOSIS — Z86010 Personal history of colonic polyps: Secondary | ICD-10-CM

## 2020-01-25 HISTORY — PX: COLONOSCOPY: SHX174

## 2020-01-25 MED ORDER — SODIUM CHLORIDE 0.9 % IV SOLN: 500.0000 mL | Freq: Once | INTRAVENOUS | Status: DC

## 2020-01-25 NOTE — Patient Instructions (Addendum)
YOU HAD AN ENDOSCOPIC PROCEDURE TODAY AT [facility]:   Refer to the procedure report that was given to you for any specific questions about what was found during the examination.  If the procedure report does not answer your questions, please call your gastroenterologist to clarify.  If you requested that your care partner not be given the details of your procedure findings, then the procedure report has been included in a sealed envelope for you to review at your convenience later.  YOU SHOULD EXPECT: Some feelings of bloating in the abdomen. Passage of more gas than usual.  Walking can help get rid of the air that was put into your GI tract during the procedure and reduce the bloating. If you had a lower endoscopy (such as a colonoscopy or flexible sigmoidoscopy) you may notice spotting of blood in your stool or on the toilet paper. If you underwent a bowel prep for your procedure, you may not have a normal bowel movement for a few days.  Please Note:  You might notice some irritation and congestion in your nose or some drainage.  This is from the oxygen used during your procedure.  There is no need for concern and it should clear up in a day or so.  SYMPTOMS TO REPORT IMMEDIATELY:   Following lower endoscopy (colonoscopy or flexible sigmoidoscopy):  Excessive amounts of blood in the stool  Significant tenderness or worsening of abdominal pains  Swelling of the abdomen that is new, acute  Fever of 100F or higher   For urgent or emergent issues, a gastroenterologist can be reached at any hour by calling [phone]. Do not use MyChart messaging for urgent concerns.    DIET:  We do recommend a small meal at first, but then you may proceed to your regular diet.  Drink plenty of fluids but you should avoid alcoholic beverages for 24 hours.  MEDICATIONS: Continue present medications.   ACTIVITY:  You should plan to take it easy for the rest of today and you should NOT  DRIVE or use heavy machinery until tomorrow (because of the sedation medicines used during the test).    FOLLOW UP: Our staff will call the number listed on your records 48-72 hours following your procedure to check on you and address any questions or concerns that you may have regarding the information given to you following your procedure. If we do not reach you, we will leave a message.  We will attempt to reach you two times.  During this call, we will ask if you have developed any symptoms of COVID 19. If you develop any symptoms (ie: fever, flu-like symptoms, shortness of breath, cough etc.) before then, please call [phone].  If you test positive for Covid 19 in the 2 weeks post procedure, please call and report this information to [place].    If any biopsies were taken you will be contacted by phone or by letter within the next 1-3 weeks.  Please call us at [phone] if you have not heard about the biopsies in 3 weeks.   Thank you for allowing [place] to provide for your healthcare needs today.   SIGNATURES/CONFIDENTIALITY: You and/or your care partner have signed paperwork which will be entered into your electronic medical record.  These signatures attest to the fact that that the information above on your After Visit Summary has been reviewed and is understood.  Full responsibility of the confidentiality of this discharge information lies with you and/or your  care-partner.

## 2020-01-25 NOTE — Op Note (Signed)
[facility] Patient Name: Miguel Peters Procedure Date: 01/25/2020 8:39 AM MRN: [id number] Endoscopist: [place]. Loletha Carrow , MD Age: 69 Referring MD:  Date of Birth: 08-25-1950 Gender: Male Account #: [id number] Procedure:                Colonoscopy Indications:              Surveillance: Personal history of adenomatous                            polyps on last colonoscopy 5 years ago (TA x 3 ;                            09/2014) Medicines:                Monitored Anesthesia Care Procedure:                Pre-Anesthesia Assessment:                           - Prior to the procedure, a History and Physical                            was performed, and patient medications and                            allergies were reviewed. The patient's tolerance of                            previous anesthesia was also reviewed. The risks                            and benefits of the procedure and the sedation                            options and risks were discussed with the patient.                            All questions were answered, and informed consent                            was obtained. Prior Anticoagulants: The patient has                            taken no previous anticoagulant or antiplatelet                            agents except for aspirin. ASA Grade Assessment: II                            - A patient with mild systemic disease. After                            reviewing the risks and benefits, the patient was  deemed in satisfactory condition to undergo the                            procedure.                           After obtaining informed consent, the colonoscope                            was passed under direct vision. Throughout the                            procedure, the patient's blood pressure, pulse, and                            oxygen saturations were monitored continuously. The                             Colonoscope was introduced through the anus and                            advanced to the the cecum, identified by                            appendiceal orifice and ileocecal valve. The                            colonoscopy was performed with difficulty due to a                            redundant colon and significant looping. Successful                            completion of the procedure was aided by changing                            the patient to a semi-supine position and using                            manual pressure. The patient tolerated the                            procedure well. The quality of the bowel                            preparation was excellent. The ileocecal valve,                            appendiceal orifice, and rectum were photographed.                            The bowel preparation used was Miralax. Scope In: 8:46:00 AM Scope Out: 9:08:30 AM Scope Withdrawal Time: 0 hours 9 minutes 54 seconds  Total Procedure Duration: 0 hours 22 minutes 30  seconds  Findings:                 The perianal and digital rectal examinations were                            normal.                           The colon (entire examined portion) was                            significantly redundant, making scope passage                            challenging.                           There is no endoscopic evidence of polyps in the                            entire colon.                           The exam was otherwise without abnormality on                            direct and retroflexion views. Complications:            No immediate complications. Estimated Blood Loss:     Estimated blood loss: none. Impression:               - Redundant colon.                           - The examination was otherwise normal on direct                            and retroflexion views.                           - No specimens collected. Recommendation:           - Patient has a  contact number available for                            emergencies. The signs and symptoms of potential                            delayed complications were discussed with the                            patient. Return to normal activities tomorrow.                            Written discharge instructions were provided to the                            patient.                           -  Resume previous diet.                           - Continue present medications.                           - Based on current guidelines, no repeat screening                            colonoscopy recommended. Hally Colella L. Loletha Carrow, MD 01/25/2020 9:14:41 AM This report has been signed electronically.

## 2020-01-25 NOTE — Progress Notes (Signed)
Pt. Reportedly had emesis during procedure per CRNA and was suctioned.Pt. did well in recovery, denies SOB, O2 sat 98% on RA. Pt. Does c/o of a sore throat which is not unusual post suctioning. Provided additional education to patient and care partner (his sister) related to SXS of aspiration and to call us immediately to report fever, SOB, cough etc. In additional to routine discharge instructions. Pt. And care partner verbalize understanding.

## 2020-01-25 NOTE — Progress Notes (Signed)
PT taken to PACU. Monitors in place. VSS. Report given to RN. 

## 2020-01-25 NOTE — Progress Notes (Signed)
Pt's states no medical or surgical changes since previsit or office visit.  ° °Vitals CW °

## 2020-01-30 ENCOUNTER — Telehealth: Payer: Self-pay

## 2020-01-30 NOTE — Telephone Encounter (Signed)
1st follow up call made.  NALM 

## 2020-01-30 NOTE — Telephone Encounter (Signed)
  Follow up Call-  Call back number 01/25/2020  Post procedure Call Back phone  # [phone]  Permission to leave phone message Yes  Some recent data might be hidden     Patient questions:  Do you have a fever, pain , or abdominal swelling? No. Pain Score  0 *  Have you tolerated food without any problems? Yes.    Have you been able to return to your normal activities? Yes.    Do you have any questions about your discharge instructions: Diet   No. Medications  No. Follow up visit  No.  Do you have questions or concerns about your Care? No.  Actions: * If pain score is 4 or above: No action needed, pain <4. 1. Have you developed a fever since your procedure? no  2.   Have you had an respiratory symptoms (SOB or cough) since your procedure? no  3.   Have you tested positive for COVID 19 since your procedure no  4.   Have you had any family members/close contacts diagnosed with the COVID 19 since your procedure?  no   If yes to any of these questions please route to Joylene John, RN and Erenest Rasher, RN

## 2020-01-31 ENCOUNTER — Telehealth: Payer: Self-pay | Admitting: Family Medicine

## 2020-01-31 NOTE — Telephone Encounter (Signed)
Left message for patient to schedule Annual Wellness Visit.  Please schedule with Nurse Health Advisor Shannon Crews, RN at [facility]  

## 2020-04-04 ENCOUNTER — Other Ambulatory Visit: Payer: Self-pay | Admitting: Family Medicine

## 2020-04-04 NOTE — Telephone Encounter (Signed)
Pt is calling in stating that he needs a refill on the following Rx calcipotriene (DOVONOX) 0.005%.  Pharm:  [facility] [address]

## 2020-04-26 DIAGNOSIS — L408 Other psoriasis: Secondary | ICD-10-CM | POA: Diagnosis not present

## 2020-04-26 DIAGNOSIS — L731 Pseudofolliculitis barbae: Secondary | ICD-10-CM | POA: Diagnosis not present

## 2020-05-11 ENCOUNTER — Other Ambulatory Visit: Payer: Self-pay

## 2020-05-11 ENCOUNTER — Ambulatory Visit: Payer: Medicare Other | Attending: Internal Medicine

## 2020-05-11 DIAGNOSIS — Z23 Encounter for immunization: Secondary | ICD-10-CM

## 2020-05-11 NOTE — Progress Notes (Signed)
   Covid-19 Vaccination Clinic  Name:  JONMARC BODKIN    MRN: [id number] DOB: [birth year]  05/11/2020  Mr. [place] was observed post Covid-19 immunization for 15 minutes without incident. He was provided with Vaccine Information Sheet and instruction to access the V-Safe system.   Mr. Shovlin was instructed to call 911 with any severe reactions post vaccine: Marland Kitchen Difficulty breathing  . Swelling of face and throat  . A fast heartbeat  . A bad rash all over body  . Dizziness and weakness

## 2020-06-14 ENCOUNTER — Ambulatory Visit (INDEPENDENT_AMBULATORY_CARE_PROVIDER_SITE_OTHER): Payer: Medicare Other | Admitting: Family Medicine

## 2020-06-14 ENCOUNTER — Encounter: Payer: Self-pay | Admitting: Family Medicine

## 2020-06-14 ENCOUNTER — Other Ambulatory Visit: Payer: Self-pay

## 2020-06-14 VITALS — BP 134/82 | HR 73 | Temp 98.6°F | Resp 16 | Ht 75.0 in | Wt 202.2 lb

## 2020-06-14 DIAGNOSIS — Z23 Encounter for immunization: Secondary | ICD-10-CM

## 2020-06-14 DIAGNOSIS — L602 Onychogryphosis: Secondary | ICD-10-CM

## 2020-06-14 DIAGNOSIS — I83893 Varicose veins of bilateral lower extremities with other complications: Secondary | ICD-10-CM | POA: Diagnosis not present

## 2020-06-14 DIAGNOSIS — M79604 Pain in right leg: Secondary | ICD-10-CM | POA: Diagnosis not present

## 2020-06-14 NOTE — Patient Instructions (Addendum)
A few things to remember from today's visit:   Pain of right lower extremity  Varicose veins of bilateral lower extremities with other complications  Today your have pulses and good capillary refill. Varicose veins could be causing pain. Compression stoking may help.  You can use pomace stone to scrub gentle on toe callus. You can also soak feet in Epson salt with warm water a few times per week. Good skin care.  Please be sure medication list is accurate. If a new problem present, please set up appointment sooner than planned today.

## 2020-06-14 NOTE — Progress Notes (Addendum)
Chief Complaint  Patient presents with  . Leg Pain   HPI: Miguel Peters is a 69 y.o. male, who is here today with above concern.  1-2 months of RLE/foot pain. He thinks pain is caused by vein disease, he can see vein engorged after long walk.  Exacerbated by walking. Alleviated by rest. No associated edema or erythema. Negative for cyanosis,numbness,weankness, or coldness sensation.  No hx of trauma. Left heel and medial ankle pain. Some sore calluses on toes.  He has not tried OTC med.He takes Motrin for OA in general.  He has no problem with LLE.  Negative for fever,chills,abdominal pain,lower back pain,decreased urine output,gross hematuria,or dysuria.  Review of Systems  Constitutional: Negative for activity change, appetite change and fatigue.  Respiratory: Negative for cough, shortness of breath and wheezing.   Cardiovascular: Negative for chest pain, palpitations and leg swelling.  Gastrointestinal: Negative for nausea and vomiting.  Musculoskeletal: Negative for gait problem.  Rest see pertinent positives and negatives per HPI.  Current Outpatient Medications on File Prior to Visit  Medication Sig Dispense Refill  . aspirin 81 MG tablet Take 81 mg by mouth daily.    . calcipotriene (DOVONOX) 0.005 % cream APP TOPICALLY TO GROIN AND BUTTOCKS BID    . ibuprofen (ADVIL) 200 MG tablet Take 200 mg by mouth every 6 (six) hours as needed.    . simvastatin (ZOCOR) 20 MG tablet Take 1 tablet (20 mg total) by mouth at bedtime. 90 tablet 3  . triamcinolone cream (KENALOG) 0.1 % Apply 1 application topically 2 (two) times daily as needed. 30 g 2   Current Facility-Administered Medications on File Prior to Visit  Medication Dose Route Frequency Provider Last Rate Last Admin  . 0.9 %  sodium chloride infusion  500 mL Intravenous Once Doran Stabler, MD       Past Medical History:  Diagnosis Date  . Arthritis   . Bell's palsy 2016   Right sided.  Not clear  when acutely suffered this.  Was noted on exam after years of no medical attention 07/2014.  Evaluated by [facility]  subsequently.  Marland Kitchen Hyperlipidemia   . Sickle cell trait ([place])   . Stutter age 42   cannot recall if something happened in life that set this off.  . Weight loss 02/12/2017   No Known Allergies  Social History   Socioeconomic History  . Marital status: Single    Spouse name: Not on file  . Number of children: 0  . Years of education: 25  . Highest education level: 12th grade  Occupational History  . Occupation: Maintenance/Housekeeping    Employer: [employer]    Comment: 2016  Tobacco Use  . Smoking status: Never Smoker  . Smokeless tobacco: Never Used  Vaping Use  . Vaping Use: Never used  Substance and Sexual Activity  . Alcohol use: Yes    Alcohol/week: 0.0 standard drinks    Comment: Once a monthly  . Drug use: No  . Sexual activity: Yes    Partners: Female  Other Topics Concern  . Not on file  Social History Narrative   Was living at a boarding house.   Patient was having difficulty affording food--reportedly because he lived in boarding house and did not have utilitiy bills, his EBT dollars were cut to $13 to $15 per month.   That has been changed.  He now receives adequate EBT dollars.      Now living  at [facility].  This is for folks with a disability.  He has an apartment now with a kitchen.  The [place] near him set him up in these apartments that they support.     Grant Ruts [phone]      09/05/18: Pt. Still living at [facility] [address], likes it. Sometimes participates in events hosted by center there.   Lives alone, but sister local, and main source of support.   Enjoys doing own workout routine with weights and walking. Enjoys reading.      Social Determinants of Health   Financial Resource Strain:   . Difficulty of Paying Living Expenses: Not on file  Food Insecurity:   . Worried About Charity fundraiser in  the Last Year: Not on file  . Ran Out of Food in the Last Year: Not on file  Transportation Needs:   . Lack of Transportation (Medical): Not on file  . Lack of Transportation (Non-Medical): Not on file  Physical Activity:   . Days of Exercise per Week: Not on file  . Minutes of Exercise per Session: Not on file  Stress:   . Feeling of Stress : Not on file  Social Connections:   . Frequency of Communication with Friends and Family: Not on file  . Frequency of Social Gatherings with Friends and Family: Not on file  . Attends Religious Services: Not on file  . Active Member of Clubs or Organizations: Not on file  . Attends Archivist Meetings: Not on file  . Marital Status: Not on file    Vitals:   06/14/20 0943  BP: 134/82  Pulse: 73  Resp: 16  Temp: 98.6 F (37 C)  SpO2: 94%   Body mass index is 25.27 kg/m.  Physical Exam Vitals and nursing note reviewed.  Constitutional:      General: He is not in acute distress.    Appearance: Normal appearance. He is well-developed.  HENT:     Head: Normocephalic and atraumatic.  Eyes:     Conjunctiva/sclera: Conjunctivae normal.  Cardiovascular:     Rate and Rhythm: Normal rate and regular rhythm.     Comments: DP pulses present. Varicose veins ,confluent telangiectasis above medial ankle.  Pulmonary:     Effort: Pulmonary effort is normal. No respiratory distress.     Breath sounds: Normal breath sounds.  Musculoskeletal:     Right lower leg: No edema.     Left lower leg: No edema.  Skin:    General: Skin is warm.     Findings: No erythema or rash.     Comments: Hypertrophic toenails. Hyperkeratotic lesion tip of 3rd toe,under distal toenail. No erythema,edema,or tenderness.   Neurological:     General: No focal deficit present.     Mental Status: He is alert and oriented to person, place, and time.     Comments: Stable gait otherwise, not assisted.  Psychiatric:     Comments: Well groomed, good eye contact.     ASSESSMENT AND PLAN:  [name] was seen today for leg pain.  Diagnoses and all orders for this visit:  Pain of right lower extremity Possible etiologies discussed. Distal pulses are present. For now no further work up. Instructed about warning signs.  Varicose veins of bilateral lower extremities with other complications LE elevation  And compression stockings may help. Appropriate skin care.  Hypertrophic toenail This could be contributing to toe tenderness. Soaking foot in warm water with epson salt for 10-15 min  and then scrubbing to toe callus area with a pomace stone may help. Also to keep nail trimmed with a nail file. Appropriate foot wear.  He can see podiatrist if needed.  Need for influenza vaccination -     Flu Vaccine QUAD High Dose(Fluad)   Return if symptoms worsen or fail to improve.   Ravis Herne G. [place], MD  [facility]. [place] office.  A few things to remember from today's visit:   Pain of right lower extremity  Varicose veins of bilateral lower extremities with other complications  Today your have pulses and good capillary refill. Varicose veins could be causing pain. Compression stoking may help.  You can use pomace stone to scrub gentle on toe callus. You can also soak feet in Epson salt with warm water a few times per week. Good skin care.  Please be sure medication list is accurate. If a new problem present, please set up appointment sooner than planned today.

## 2020-06-28 ENCOUNTER — Other Ambulatory Visit: Payer: Self-pay | Admitting: Family Medicine

## 2020-07-31 ENCOUNTER — Telehealth: Payer: Self-pay | Admitting: Family Medicine

## 2020-07-31 NOTE — Telephone Encounter (Signed)
Left message for patient to call back and schedule Medicare Annual Wellness Visit (AWV) either virtually or in office.   Last AWV 09/05/2018  please schedule at anytime with [facility] Nurse Health Advisor 1 or 2   This should be a 45 minute visit.

## 2020-11-05 ENCOUNTER — Ambulatory Visit (INDEPENDENT_AMBULATORY_CARE_PROVIDER_SITE_OTHER): Payer: Medicare Other

## 2020-11-05 ENCOUNTER — Ambulatory Visit: Payer: Medicare Other

## 2020-11-05 ENCOUNTER — Other Ambulatory Visit: Payer: Self-pay

## 2020-11-05 DIAGNOSIS — Z0000 Encounter for general adult medical examination without abnormal findings: Secondary | ICD-10-CM

## 2020-11-05 NOTE — Progress Notes (Signed)
Virtual Visit via Telephone Note  I connected with  Miguel Peters on 11/05/20 at 11:45 AM EDT by telephone and verified that I am speaking with the correct person using two identifiers.  Medicare Annual Wellness visit completed telephonically due to Covid-19 pandemic.   Persons participating in this call: This Health Coach and this patient.   Location: Patient: Home Provider: Office    I discussed the limitations, risks, security and privacy concerns of performing an evaluation and management service by telephone and the availability of in person appointments. The patient expressed understanding and agreed to proceed.  Unable to perform video visit due to video visit attempted and failed and/or patient does not have video capability.   Some vital signs may be absent or patient reported.   Willette Brace, LPN    Subjective:   Miguel Peters is a 70 y.o. male who presents for Medicare Annual/Subsequent preventive examination.  Review of Systems     Cardiac Risk Factors include: advanced age (>18men, >30 women);dyslipidemia;male gender     Objective:    There were no vitals filed for this visit. There is no height or weight on file to calculate BMI.  Advanced Directives 11/05/2020 09/05/2018 06/22/2017 10/05/2014 09/19/2014  Does Patient Have a Medical Advance Directive? No No No No No  Would patient like information on creating a medical advance directive? No - Patient declined Yes (MAU/Ambulatory/Procedural Areas - Information given) Yes (MAU/Ambulatory/Procedural Areas - Information given) - -    Current Medications (verified) Outpatient Encounter Medications as of 11/05/2020  Medication Sig  . aspirin 81 MG tablet Take 81 mg by mouth daily.  . calcipotriene (DOVONOX) 0.005 % cream APP TOPICALLY TO GROIN AND BUTTOCKS BID  . clindamycin (CLEOCIN T) 1 % external solution Apply topically 2 (two) times daily.  Marland Kitchen ibuprofen (ADVIL) 200 MG tablet Take 200 mg by mouth  every 6 (six) hours as needed.  . simvastatin (ZOCOR) 20 MG tablet TAKE 1 TABLET(20 MG) BY MOUTH AT BEDTIME  . triamcinolone cream (KENALOG) 0.1 % Apply 1 application topically 2 (two) times daily as needed.   Facility-Administered Encounter Medications as of 11/05/2020  Medication  . 0.9 %  sodium chloride infusion    Allergies (verified) Patient has no known allergies.   History: Past Medical History:  Diagnosis Date  . Arthritis   . Bell's palsy 2016   Right sided.  Not clear when acutely suffered this.  Was noted on exam after years of no medical attention 07/2014.  Evaluated by [facility]  subsequently.  Marland Kitchen Hyperlipidemia   . Sickle cell trait ([place])   . Stutter age 36   cannot recall if something happened in life that set this off.  . Weight loss 02/12/2017   Past Surgical History:  Procedure Laterality Date  . COLONOSCOPY  10/05/2014   db 3 TAs  . COLONOSCOPY  01/25/2020  . hand surgery     thumb  . POLYPECTOMY    . TONSILLECTOMY     Family History  Problem Relation Age of Onset  . Sickle cell anemia Mother   . Kidney disease Father   . Alcohol abuse Father   . Colon cancer Neg Hx   . Esophageal cancer Neg Hx   . Stomach cancer Neg Hx   . Rectal cancer Neg Hx   . Colon polyps Neg Hx    Social History   Socioeconomic History  . Marital status: Single    Spouse name: Not on file  .  Number of children: 0  . Years of education: 80  . Highest education level: 12th grade  Occupational History  . Occupation: Maintenance/Housekeeping    Employer: [employer]    Comment: 2016  Tobacco Use  . Smoking status: Never Smoker  . Smokeless tobacco: Never Used  Vaping Use  . Vaping Use: Never used  Substance and Sexual Activity  . Alcohol use: Yes    Alcohol/week: 0.0 standard drinks    Comment: Once a monthly  . Drug use: No  . Sexual activity: Yes    Partners: Female  Other Topics Concern  . Not on file  Social History Narrative   Was  living at a boarding house.   Patient was having difficulty affording food--reportedly because he lived in boarding house and did not have utilitiy bills, his EBT dollars were cut to $13 to $15 per month.   That has been changed.  He now receives adequate EBT dollars.      Now living at [facility].  This is for folks with a disability.  He has an apartment now with a kitchen.  The [place] near him set him up in these apartments that they support.     Grant Ruts [phone]      09/05/18: Pt. Still living at [facility] [address], likes it. Sometimes participates in events hosted by center there.   Lives alone, but sister local, and main source of support.   Enjoys doing own workout routine with weights and walking. Enjoys reading.      Social Determinants of Health   Financial Resource Strain: Not on file  Food Insecurity: Not on file  Transportation Needs: Not on file  Physical Activity: Sufficiently Active  . Days of Exercise per Week: 5 days  . Minutes of Exercise per Session: 60 min  Stress: No Stress Concern Present  . Feeling of Stress : Only a little  Social Connections: Socially Isolated  . Frequency of Communication with Friends and Family: Once a week  . Frequency of Social Gatherings with Friends and Family: Once a week  . Attends Religious Services: Never  . Active Member of Clubs or Organizations: No  . Attends Archivist Meetings: Never  . Marital Status: Never married    Tobacco Counseling Counseling given: Not Answered   Clinical Intake:  Pre-visit preparation completed: Yes  Pain : No/denies pain     BMI - recorded: 25.27 Nutritional Risks: None Diabetes: No  How often do you need to have someone help you when you read instructions, pamphlets, or other written materials from your doctor or pharmacy?: 1 - Never  Diabetic?No  Interpreter Needed?: No  Information entered by :: Charlott Rakes, LPN   Activities of Daily Living In your  present state of health, do you have any difficulty performing the following activities: 11/05/2020  Hearing? N  Vision? N  Difficulty concentrating or making decisions? N  Walking or climbing stairs? N  Dressing or bathing? N  Doing errands, shopping? N  Preparing Food and eating ? N  Using the Toilet? N  In the past six months, have you accidently leaked urine? N  Do you have problems with loss of bowel control? N  Managing your Medications? N  Managing your Finances? N  Housekeeping or managing your Housekeeping? N  Some recent data might be hidden    Patient Care Team: [place], Betty G, MD as PCP - General (Family Medicine)  Indicate any recent Medical Services you may  have received from other than [facility] providers in the past year (date may be approximate).     Assessment:   This is a routine wellness examination for [facility]  Hearing/Vision screen  Hearing Screening   125Hz  250Hz  500Hz  1000Hz  2000Hz  3000Hz  4000Hz  6000Hz  8000Hz   Right ear:           Left ear:           Comments: Pt denies any hearing issues   Vision Screening Comments: Pt doesn't followup with eye exams , encouraged pt to follow   Dietary issues and exercise activities discussed: Current Exercise Habits: Home exercise routine, Type of exercise: walking, Time (Minutes): 60, Frequency (Times/Week): 5, Weekly Exercise (Minutes/Week): 300  Goals    . Patient Stated     Maintaining current health status, maybe look into silver sneakers    . Patient Stated     Stay healthy       Depression Screen PHQ 2/9 Scores 11/05/2020 09/13/2019 09/05/2018 08/30/2017 08/14/2014  PHQ - 2 Score 0 0 0 0 0  PHQ- 9 Score - - 0 - -    Fall Risk Fall Risk  11/05/2020 10/04/2019 09/05/2018 08/14/2014  Falls in the past year? 0 0 0 No  Number falls in past yr: 0 0 - -  Injury with Fall? 0 0 - -  Follow up Falls prevention discussed Education provided - -    FALL RISK PREVENTION PERTAINING TO THE HOME:  Any stairs in or around  the home? No  If so, are there any without handrails? No  Home free of loose throw rugs in walkways, pet beds, electrical cords, etc? Yes  Adequate lighting in your home to reduce risk of falls? Yes   ASSISTIVE DEVICES UTILIZED TO PREVENT FALLS:  Life alert? No  Use of a cane, walker or w/c? No  Grab bars in the bathroom? No  Shower chair or bench in shower? No  Elevated toilet seat or a handicapped toilet? No   TIMED UP AND GO:  Was the test performed? No     Cognitive Function:     6CIT Screen 11/05/2020  What Year? 0 points  What month? 0 points  Count back from 20 0 points  Months in reverse 0 points  Repeat phrase 6 points    Immunizations Immunization History  Administered Date(s) Administered  . Fluad Quad(high Dose 65+) 04/28/2019, 06/14/2020  . Influenza-Unspecified 06/26/2018  . PFIZER(Purple Top)SARS-COV-2 Vaccination 08/18/2019, 09/08/2019, 12/06/2019, 05/11/2020  . Pneumococcal Conjugate-13 09/05/2018  . Pneumococcal Polysaccharide-23 09/28/2019  . Tdap 06/22/2017    TDAP status: Up to date  Flu Vaccine status: Up to date  Pneumococcal vaccine status: Up to date  Covid-19 vaccine status: Completed vaccines  Qualifies for Shingles Vaccine? Yes   Zostavax completed No   Shingrix Completed?: No.    Education has been provided regarding the importance of this vaccine. Patient has been advised to call insurance company to determine out of pocket expense if they have not yet received this vaccine. Advised may also receive vaccine at local pharmacy or Health Dept. Verbalized acceptance and understanding.  Screening Tests Health Maintenance  Topic Date Due  . INFLUENZA VACCINE  02/24/2021  . TETANUS/TDAP  06/23/2027  . COVID-19 Vaccine  Completed  . Hepatitis C Screening  Completed  . PNA vac Low Risk Adult  Completed  . HPV VACCINES  Aged Out    Health Maintenance  There are no preventive care reminders to display for this patient.  Colorectal  cancer screening: No longer required.    Additional Screening:  Hepatitis C Screening:  Completed 09/12/18  Vision Screening: Recommended annual ophthalmology exams for early detection of glaucoma and other disorders of the eye. Is the patient up to date with their annual eye exam?  No  Who is the provider or what is the name of the office in which the patient attends annual eye exams?  If pt is not established with a provider, would they like to be referred to a provider to establish care? Yes .   Dental Screening: Recommended annual dental exams for proper oral hygiene  Community Resource Referral / Chronic Care Management: CRR required this visit?  No   CCM required this visit?  No      Plan:     I have personally reviewed and noted the following in the patient's chart:   . Medical and social history . Use of alcohol, tobacco or illicit drugs  . Current medications and supplements . Functional ability and status . Nutritional status . Physical activity . Advanced directives . List of other physicians . Hospitalizations, surgeries, and ER visits in previous 12 months . Vitals . Screenings to include cognitive, depression, and falls . Referrals and appointments  In addition, I have reviewed and discussed with patient certain preventive protocols, quality metrics, and best practice recommendations. A written personalized care plan for preventive services as well as general preventive health recommendations were provided to patient.     Willette Brace, LPN   [id number]/[id number]   Nurse Notes: Pt is requesting a referral to the eye Dr for annual exams

## 2020-11-05 NOTE — Patient Instructions (Addendum)
Miguel Peters , Thank you for taking time to come for your Medicare Wellness Visit. I appreciate your ongoing commitment to your health goals. Please review the following plan we discussed and let me know if I can assist you in the future.   Screening recommendations/referrals: Colonoscopy: No Longer required  Recommended yearly ophthalmology/optometry visit for glaucoma screening and checkup Recommended yearly dental visit for hygiene and checkup  Vaccinations: Influenza vaccine: Up to date Pneumococcal vaccine: Up to date Tdap vaccine: Up to date Shingles vaccine: Shingrix discussed. Please contact your pharmacy for coverage information.    Covid-19: Completed 08/17/10, 09/08/19, 11/26/19, 05/11/20  Advanced directives: Advance directive discussed with you today. Even though you declined this today please call our office should you change your mind and we can give you the proper paperwork for you to fill out.  Conditions/risks identified: Stay healthy   Next appointment: Follow up in one year for your annual wellness visit.   Preventive Care 33 Years and Older, Male Preventive care refers to lifestyle choices and visits with your health care provider that can promote health and wellness. What does preventive care include?  A yearly physical exam. This is also called an annual well check.  Dental exams once or twice a year.  Routine eye exams. Ask your health care provider how often you should have your eyes checked.  Personal lifestyle choices, including:  Daily care of your teeth and gums.  Regular physical activity.  Eating a healthy diet.  Avoiding tobacco and drug use.  Limiting alcohol use.  Practicing safe sex.  Taking low doses of aspirin every day.  Taking vitamin and mineral supplements as recommended by your health care provider. What happens during an annual well check? The services and screenings done by your health care provider during your annual well  check will depend on your age, overall health, lifestyle risk factors, and family history of disease. Counseling  Your health care provider may ask you questions about your:  Alcohol use.  Tobacco use.  Drug use.  Emotional well-being.  Home and relationship well-being.  Sexual activity.  Eating habits.  History of falls.  Memory and ability to understand (cognition).  Work and work Statistician. Screening  You may have the following tests or measurements:  Height, weight, and BMI.  Blood pressure.  Lipid and cholesterol levels. These may be checked every 5 years, or more frequently if you are over 39 years old.  Skin check.  Lung cancer screening. You may have this screening every year starting at age 24 if you have a 30-pack-year history of smoking and currently smoke or have quit within the past 15 years.  Fecal occult blood test (FOBT) of the stool. You may have this test every year starting at age 5.  Flexible sigmoidoscopy or colonoscopy. You may have a sigmoidoscopy every 5 years or a colonoscopy every 10 years starting at age 58.  Prostate cancer screening. Recommendations will vary depending on your family history and other risks.  Hepatitis C blood test.  Hepatitis B blood test.  Sexually transmitted disease (STD) testing.  Diabetes screening. This is done by checking your blood sugar (glucose) after you have not eaten for a while (fasting). You may have this done every 1-3 years.  Abdominal aortic aneurysm (AAA) screening. You may need this if you are a current or former smoker.  Osteoporosis. You may be screened starting at age 13 if you are at high risk. Talk with your health care provider about  your test results, treatment options, and if necessary, the need for more tests. Vaccines  Your health care provider may recommend certain vaccines, such as:  Influenza vaccine. This is recommended every year.  Tetanus, diphtheria, and acellular  pertussis (Tdap, Td) vaccine. You may need a Td booster every 10 years.  Zoster vaccine. You may need this after age 66.  Pneumococcal 13-valent conjugate (PCV13) vaccine. One dose is recommended after age 50.  Pneumococcal polysaccharide (PPSV23) vaccine. One dose is recommended after age 19. Talk to your health care provider about which screenings and vaccines you need and how often you need them. This information is not intended to replace advice given to you by your health care provider. Make sure you discuss any questions you have with your health care provider. Document Released: 08/09/2015 Document Revised: 04/01/2016 Document Reviewed: 05/14/2015 Elsevier Interactive Patient Education  2017 [place] Prevention in the Home Falls can cause injuries. They can happen to people of all ages. There are many things you can do to make your home safe and to help prevent falls. What can I do on the outside of my home?  Regularly fix the edges of walkways and driveways and fix any cracks.  Remove anything that might make you trip as you walk through a door, such as a raised step or threshold.  Trim any bushes or trees on the path to your home.  Use bright outdoor lighting.  Clear any walking paths of anything that might make someone trip, such as rocks or tools.  Regularly check to see if handrails are loose or broken. Make sure that both sides of any steps have handrails.  Any raised decks and porches should have guardrails on the edges.  Have any leaves, snow, or ice cleared regularly.  Use sand or salt on walking paths during winter.  Clean up any spills in your garage right away. This includes oil or grease spills. What can I do in the bathroom?  Use night lights.  Install grab bars by the toilet and in the tub and shower. Do not use towel bars as grab bars.  Use non-skid mats or decals in the tub or shower.  If you need to sit down in the shower, use a plastic,  non-slip stool.  Keep the floor dry. Clean up any water that spills on the floor as soon as it happens.  Remove soap buildup in the tub or shower regularly.  Attach bath mats securely with double-sided non-slip rug tape.  Do not have throw rugs and other things on the floor that can make you trip. What can I do in the bedroom?  Use night lights.  Make sure that you have a light by your bed that is easy to reach.  Do not use any sheets or blankets that are too big for your bed. They should not hang down onto the floor.  Have a firm chair that has side arms. You can use this for support while you get dressed.  Do not have throw rugs and other things on the floor that can make you trip. What can I do in the kitchen?  Clean up any spills right away.  Avoid walking on wet floors.  Keep items that you use a lot in easy-to-reach places.  If you need to reach something above you, use a strong step stool that has a grab bar.  Keep electrical cords out of the way.  Do not use floor polish or wax  that makes floors slippery. If you must use wax, use non-skid floor wax.  Do not have throw rugs and other things on the floor that can make you trip. What can I do with my stairs?  Do not leave any items on the stairs.  Make sure that there are handrails on both sides of the stairs and use them. Fix handrails that are broken or loose. Make sure that handrails are as long as the stairways.  Check any carpeting to make sure that it is firmly attached to the stairs. Fix any carpet that is loose or worn.  Avoid having throw rugs at the top or bottom of the stairs. If you do have throw rugs, attach them to the floor with carpet tape.  Make sure that you have a light switch at the top of the stairs and the bottom of the stairs. If you do not have them, ask someone to add them for you. What else can I do to help prevent falls?  Wear shoes that:  Do not have high heels.  Have rubber  bottoms.  Are comfortable and fit you well.  Are closed at the toe. Do not wear sandals.  If you use a stepladder:  Make sure that it is fully opened. Do not climb a closed stepladder.  Make sure that both sides of the stepladder are locked into place.  Ask someone to hold it for you, if possible.  Clearly mark and make sure that you can see:  Any grab bars or handrails.  First and last steps.  Where the edge of each step is.  Use tools that help you move around (mobility aids) if they are needed. These include:  Canes.  Walkers.  Scooters.  Crutches.  Turn on the lights when you go into a dark area. Replace any light bulbs as soon as they burn out.  Set up your furniture so you have a clear path. Avoid moving your furniture around.  If any of your floors are uneven, fix them.  If there are any pets around you, be aware of where they are.  Review your medicines with your doctor. Some medicines can make you feel dizzy. This can increase your chance of falling. Ask your doctor what other things that you can do to help prevent falls. This information is not intended to replace advice given to you by your health care provider. Make sure you discuss any questions you have with your health care provider. Document Released: 05/09/2009 Document Revised: 12/19/2015 Document Reviewed: 08/17/2014 Elsevier Interactive Patient Education  2017 [employer].

## 2021-03-25 ENCOUNTER — Other Ambulatory Visit: Payer: Self-pay

## 2021-03-26 ENCOUNTER — Encounter: Payer: Self-pay | Admitting: Family Medicine

## 2021-03-26 ENCOUNTER — Ambulatory Visit (INDEPENDENT_AMBULATORY_CARE_PROVIDER_SITE_OTHER): Payer: Medicare Other | Admitting: Family Medicine

## 2021-03-26 VITALS — BP 120/70 | HR 92 | Resp 16 | Ht 75.0 in | Wt 200.5 lb

## 2021-03-26 DIAGNOSIS — H6121 Impacted cerumen, right ear: Secondary | ICD-10-CM | POA: Diagnosis not present

## 2021-03-26 DIAGNOSIS — Z0000 Encounter for general adult medical examination without abnormal findings: Secondary | ICD-10-CM | POA: Diagnosis not present

## 2021-03-26 DIAGNOSIS — E785 Hyperlipidemia, unspecified: Secondary | ICD-10-CM | POA: Diagnosis not present

## 2021-03-26 DIAGNOSIS — S90229A Contusion of unspecified lesser toe(s) with damage to nail, initial encounter: Secondary | ICD-10-CM

## 2021-03-26 LAB — COMPREHENSIVE METABOLIC PANEL
ALT: 15 U/L (ref 0–53)
AST: 16 U/L (ref 0–37)
Albumin: 4.2 g/dL (ref 3.5–5.2)
Alkaline Phosphatase: 79 U/L (ref 39–117)
BUN: 9 mg/dL (ref 6–23)
CO2: 29 mEq/L (ref 19–32)
Calcium: 9.8 mg/dL (ref 8.4–10.5)
Chloride: 104 mEq/L (ref 96–112)
Creatinine, Ser: 1.09 mg/dL (ref 0.40–1.50)
GFR: 69.7 mL/min (ref >60.00)
Glucose, Bld: 86 mg/dL (ref 70–99)
Potassium: 3.5 mEq/L (ref 3.5–5.1)
Sodium: 140 mEq/L (ref 135–145)
Total Bilirubin: 0.9 mg/dL (ref 0.2–1.2)
Total Protein: 6.7 g/dL (ref 6.0–8.3)

## 2021-03-26 LAB — LIPID PANEL
Cholesterol: 186 mg/dL (ref 0–200)
HDL: 48.1 mg/dL (ref >39.00)
LDL Cholesterol: 117 mg/dL — ABNORMAL HIGH (ref 0–99)
NonHDL: 137.56
Total CHOL/HDL Ratio: 4
Triglycerides: 102 mg/dL (ref 0.0–149.0)
VLDL: 20.4 mg/dL (ref 0.0–40.0)

## 2021-03-26 MED ORDER — DEBROX 6.5 % OT SOLN: 5.0000 [drp] | Freq: Two times a day (BID) | OTIC | 0 refills | Status: DC

## 2021-03-26 NOTE — Progress Notes (Signed)
HPI:  [name] is a 70 y.o.male here today for his routine physical examination.  Last CPE: 09/13/19. He has his AWV in 10/2020. He lives alone.  Regular exercise 3 or more times per week: Walks 2 times daily for about 60 min. Following a healthy diet: He cooks at home and follows a healthful diet. He is trying to lose wt.  Chronic medical problems: Bell's palsy,speech disorder (stutter),and HLD among some.  Immunization History  Administered Date(s) Administered   Fluad Quad(high Dose 65+) 04/28/2019, 06/14/2020   Influenza-Unspecified 06/26/2018   PFIZER(Purple Top)SARS-COV-2 Vaccination 08/18/2019, 09/08/2019, 12/06/2019, 05/11/2020   Pneumococcal Conjugate-13 09/05/2018   Pneumococcal Polysaccharide-23 09/28/2019   Tdap 06/22/2017  Shingrix in 01/2021 dose #1.  -Hep C screening: 09/12/18 NR. Last colon cancer screening: 01/25/20, no further screening was recommended. Last prostate ca screening: PSA 0.96 in 08/2019. Nocturia x 1-2, stable for years.  -Denies history of tobacco use. He drinks beer daily x 4.  -Concerns and/or follow up today:  HLD: He is on Simvastatin 20 mg daily. Lab Results  Component Value Date   CHOL 211 (H) 09/13/2019   HDL 50.80 09/13/2019   LDLCALC 142 (H) 09/13/2019   TRIG 91.0 09/13/2019   CHOLHDL 4 09/13/2019   Right 2nd toe nail changes and periungual edema + tenderness. Woke up 2 weeks ago with problem. Pain was "bad" but has resolved. He did apply alcohol and some OTC topical medications. No known trauma. Denies easy bruising ,nose/gum bleed,blood in stool,melena,or gross hematuria.  Review of Systems  Constitutional:  Negative for activity change, appetite change, fatigue and fever.  HENT:  Negative for ear pain, mouth sores and sore throat.   Eyes:  Negative for redness and visual disturbance.  Respiratory:  Negative for cough, shortness of breath and wheezing.   Cardiovascular:  Negative for chest pain, palpitations  and leg swelling.  Gastrointestinal:  Negative for abdominal pain, nausea and vomiting.  Endocrine: Negative for cold intolerance, heat intolerance, polydipsia, polyphagia and polyuria.  Genitourinary:  Negative for decreased urine volume, dysuria, genital sores and testicular pain.  Musculoskeletal:  Positive for arthralgias. Negative for gait problem and myalgias.  Skin:  Negative for color change and rash.  Neurological:  Negative for syncope, weakness and headaches.  Hematological:  Negative for adenopathy. Does not bruise/bleed easily.  Psychiatric/Behavioral:  Negative for confusion. The patient is not nervous/anxious.   All other systems reviewed and are negative.  Current Outpatient Medications on File Prior to Visit  Medication Sig Dispense Refill   aspirin 81 MG tablet Take 81 mg by mouth daily.     calcipotriene (DOVONOX) 0.005 % cream APP TOPICALLY TO GROIN AND BUTTOCKS BID     clindamycin (CLEOCIN T) 1 % external solution Apply topically 2 (two) times daily.     ibuprofen (ADVIL) 200 MG tablet Take 200 mg by mouth every 6 (six) hours as needed.     simvastatin (ZOCOR) 20 MG tablet TAKE 1 TABLET(20 MG) BY MOUTH AT BEDTIME 90 tablet 3   triamcinolone cream (KENALOG) 0.1 % Apply 1 application topically 2 (two) times daily as needed. 30 g 2   Current Facility-Administered Medications on File Prior to Visit  Medication Dose Route Frequency Provider Last Rate Last Admin   0.9 %  sodium chloride infusion  500 mL Intravenous Once Doran Stabler, MD       Past Medical History:  Diagnosis Date   Arthritis    Bell's palsy 2016  Right sided.  Not clear when acutely suffered this.  Was noted on exam after years of no medical attention 07/2014.  Evaluated by [facility]  subsequently.   Hyperlipidemia    Sickle cell trait ([place])    Stutter age 89   cannot recall if something happened in life that set this off.   Weight loss 02/12/2017   Past Surgical History:  Procedure  Laterality Date   COLONOSCOPY  10/05/2014   db 3 TAs   COLONOSCOPY  01/25/2020   hand surgery     thumb   POLYPECTOMY     TONSILLECTOMY     No Known Allergies  Family History  Problem Relation Age of Onset   Sickle cell anemia Mother    Kidney disease Father    Alcohol abuse Father    Colon cancer Neg Hx    Esophageal cancer Neg Hx    Stomach cancer Neg Hx    Rectal cancer Neg Hx    Colon polyps Neg Hx    Social History   Socioeconomic History   Marital status: Single    Spouse name: Not on file   Number of children: 0   Years of education: 12   Highest education level: 12th grade  Occupational History   Occupation: Maintenance/Housekeeping    Employer: [employer]    Comment: 2016  Tobacco Use   Smoking status: Never   Smokeless tobacco: Never  Vaping Use   Vaping Use: Never used  Substance and Sexual Activity   Alcohol use: Yes    Alcohol/week: 0.0 standard drinks    Comment: Once a monthly   Drug use: No   Sexual activity: Yes    Partners: Female  Other Topics Concern   Not on file  Social History Narrative   Was living at a boarding house.   Patient was having difficulty affording food--reportedly because he lived in boarding house and did not have utilitiy bills, his EBT dollars were cut to $13 to $15 per month.   That has been changed.  He now receives adequate EBT dollars.      Now living at [facility].  This is for folks with a disability.  He has an apartment now with a kitchen.  The [place] near him set him up in these apartments that they support.     Grant Ruts [phone]      09/05/18: Pt. Still living at [facility] [address], likes it. Sometimes participates in events hosted by center there.   Lives alone, but sister local, and main source of support.   Enjoys doing own workout routine with weights and walking. Enjoys reading.      Social Determinants of Health   Financial Resource Strain: Not on file  Food Insecurity: Not on  file  Transportation Needs: Not on file  Physical Activity: Sufficiently Active   Days of Exercise per Week: 5 days   Minutes of Exercise per Session: 60 min  Stress: No Stress Concern Present   Feeling of Stress : Only a little  Social Connections: Socially Isolated   Frequency of Communication with Friends and Family: Once a week   Frequency of Social Gatherings with Friends and Family: Once a week   Attends Religious Services: Never   Marine scientist or Organizations: No   Attends Archivist Meetings: Never   Marital Status: Never married   Vitals:   03/26/21 0711  BP: 120/70  Pulse: 92  Resp: 16  SpO2: 97%   Body mass index is 25.06 kg/m. Wt Readings from Last 3 Encounters:  03/26/21 200 lb 8 oz (90.9 kg)  06/14/20 202 lb 3.2 oz (91.7 kg)  01/25/20 202 lb (91.6 kg)   Physical Exam Vitals and nursing note reviewed.  Constitutional:      General: He is not in acute distress.    Appearance: He is well-developed.  HENT:     Head: Normocephalic and atraumatic.     Right Ear: External ear normal.     Left Ear: Tympanic membrane, ear canal and external ear normal.     Ears:     Comments: Can not see right TM due to cerumen excess.     Mouth/Throat:     Mouth: Mucous membranes are moist.     Pharynx: Oropharynx is clear.  Eyes:     Conjunctiva/sclera: Conjunctivae normal.     Pupils: Pupils are equal, round, and reactive to light.  Cardiovascular:     Rate and Rhythm: Normal rate and regular rhythm.     Pulses:          Dorsalis pedis pulses are 2+ on the right side and 2+ on the left side.     Heart sounds: No murmur heard. Pulmonary:     Effort: Pulmonary effort is normal. No respiratory distress.     Breath sounds: Normal breath sounds.  Abdominal:     Palpations: Abdomen is soft. There is no hepatomegaly or mass.     Tenderness: There is no abdominal tenderness.  Genitourinary:    Comments: No concerns. Musculoskeletal:        General: No  tenderness.     Right lower leg: No edema.     Left lower leg: No edema.     Comments: No signs of synovitis. 2nd right toe: Subungual hematoma, toenail mildly loose. No tenderness or erythema.  Lymphadenopathy:     Cervical: No cervical adenopathy.  Skin:    General: Skin is warm.     Findings: No erythema or rash.  Neurological:     General: No focal deficit present.     Mental Status: He is alert and oriented to person, place, and time.     Motor: No tremor.     Deep Tendon Reflexes:     Reflex Scores:      Bicep reflexes are 2+ on the right side and 2+ on the left side.      Patellar reflexes are 2+ on the right side and 2+ on the left side.    Comments: Right-sided facial palsy,mild. Gait otherwise stable,not assisted.   ASSESSMENT AND PLAN:  Mr.Jodi was seen today for annual exam.  Diagnoses and all orders for this visit: Orders Placed This Encounter  Procedures   Lipid panel   Comprehensive metabolic panel   Lab Results  Component Value Date   CREATININE 1.09 03/26/2021   BUN 9 03/26/2021   NA 140 03/26/2021   K 3.5 03/26/2021   CL 104 03/26/2021   CO2 29 03/26/2021   Lab Results  Component Value Date   ALT 15 03/26/2021   AST 16 03/26/2021   ALKPHOS 79 03/26/2021   BILITOT 0.9 03/26/2021   Lab Results  Component Value Date   CHOL 186 03/26/2021   HDL 48.10 03/26/2021   LDLCALC 117 (H) 03/26/2021   TRIG 102.0 03/26/2021   CHOLHDL 4 03/26/2021   Routine general medical examination at a health care facility We discussed the importance of regular  low impact physical activity and healthful diet for prevention of chronic illness and/or complications. We discussed current recommendations in regard to alcohol intake. Preventive guidelines reviewed. Vaccination up-to-date, he has an appointment for his second Shingrix at his pharmacy. Fall precautions discussed. Next CPE in a year. The 10-year ASCVD risk score Mikey Bussing DC Brooke Bonito., et al., 2013) is: 9.8%    Values used to calculate the score:     Age: 7 years     Sex: Male     Is Non-Hispanic African American: Yes     Diabetic: No     Tobacco smoker: No     Systolic Blood Pressure: 123456 mmHg     Is BP treated: No     HDL Cholesterol: 48.1 mg/dL     Total Cholesterol: 186 mg/dL  Hyperlipidemia, unspecified hyperlipidemia type Continue simvastatin 20 mg daily and low-fat diet. Further recommendation will be given according to lipid panel result.  Subungual hematoma of second toe Pain has resolved, so I do not think imaging is needed at this time. We discussed possible etiologies. History and examination today do not suggest a serious process.  Impacted cerumen of right ear I do not think ear lavage is needed at this time. Recommend avoiding Q-tips. Debrox in right ear may help.  -     carbamide peroxide (DEBROX) 6.5 % OTIC solution; Place 5 drops into the left ear 2 (two) times daily. Right ear.  Return in about 1 year (around 03/26/2022) for CPE.  Aliea Bobe G. Martinique, MD  [facility]. [place] office.

## 2021-03-26 NOTE — Patient Instructions (Addendum)
A few things to remember from today's visit:  Routine general medical examination at a health care facility  Hyperlipidemia, unspecified hyperlipidemia type - Plan: Lipid panel, Comprehensive metabolic panel  Subungual hematoma of second toe Continue monitoring toenail, most likely it is going to fall. Because pain resolved, I do not think you need imaging today.  Decrease beer intake.  If you need refills please call your pharmacy. Do not use My Chart to request refills or for acute issues that need immediate attention.   At least 150 minutes of moderate exercise per week, daily brisk walking for 15-30 min is a good exercise option. Healthy diet low in saturated (animal) fats and sweets and consisting of fresh fruits and vegetables, lean meats such as fish and white chicken and whole grains.  - Vaccines:  Tdap vaccine every 10 years.  Shingles vaccine recommended at age 64, could be given after 68 years of age but not sure about insurance coverage.Keep appt for 2nd dose at your pharmacy.  Pneumonia vaccines: Pneumovax at 75  -Screening recommendations for low/normal risk males:  Screening for diabetes at age 74 and every 3 years. Earlier screening if cardiovascular risk factors.  Lipid screening at 35 and every 3 years. Screening starts in younger males with cardiovascular risk factors.N/A  Aortic Abdominal Aneurism once between 89 and 71 years old if ever smoker.  Also recommended:  Dental visit- Brush and floss your teeth twice daily; visit your dentist twice a year. Eye doctor- Get an eye exam at least every 2 years. Helmet use- Always wear a helmet when riding a bicycle, motorcycle, rollerblading or skateboarding. Safe sex- If you may be exposed to sexually transmitted infections, use a condom. Seat belts- Seat belts can save your live; always wear one. Smoke/Carbon Monoxide detectors- These detectors need to be installed on the appropriate level of your home. Replace  batteries at least once a year. Skin cancer- When out in the sun please cover up and use sunscreen 15 SPF or higher. Violence- If anyone is threatening or hurting you, please tell your healthcare provider.  Drink alcohol in moderation- Limit alcohol intake to one drink or less per day. Never drink and drive.  Please be sure medication list is accurate. If a new problem present, please set up appointment sooner than planned today.

## 2021-05-27 ENCOUNTER — Telehealth: Payer: Self-pay | Admitting: Family Medicine

## 2021-05-27 NOTE — Telephone Encounter (Signed)
Pt call and want dr.Jordan to know that he got his flu shot.

## 2021-05-27 NOTE — Telephone Encounter (Signed)
Noted, chart updated.

## 2021-06-27 DIAGNOSIS — L408 Other psoriasis: Secondary | ICD-10-CM | POA: Diagnosis not present

## 2021-06-27 DIAGNOSIS — L821 Other seborrheic keratosis: Secondary | ICD-10-CM | POA: Diagnosis not present

## 2021-07-22 ENCOUNTER — Telehealth: Payer: Self-pay

## 2021-07-22 NOTE — Progress Notes (Signed)
ACUTE VISIT Chief Complaint  Patient presents with   Chest Pain    X a few weeks   Arm Pain    Left, x a couple days   HPI: Miguel Peters is a 70 y.o. male with hx of HLD and OA here today complaining of 1-2 weeks of intermittent CP. Starts in the middle chest. radiates to right side and sometimes to left chest area. Dull-sharp pain, sudden onset, it happens at rest and with exertion. It happens once while having a bowel movement, straining.  It lasts a few seconds.  At some point he thought about calling 911 because it was so "bad." Pain started while he was drinking beer. Negative for heartburn,nausea,or vomiting. No prior Hx.  Chest Pain  This is a new problem. The current episode started 1 to 4 weeks ago. The onset quality is sudden. The problem has been waxing and waning. The pain is present in the substernal region. The pain is at a severity of 4/10. The pain is mild. The quality of the pain is described as dull. Associated symptoms include back pain and shortness of breath. Pertinent negatives include no abdominal pain, cough, diaphoresis, dizziness, exertional chest pressure, fever, hemoptysis, irregular heartbeat, leg pain, lower extremity edema, near-syncope, numbness, orthopnea, palpitations, PND, sputum production, syncope or weakness. He has tried acetaminophen for the symptoms.   He usually walks to the groceries store and carries his groceries back home. Last week while carrying heavy bags he noted LUE pain, from elbow down to forearm. At this time no associated CP or palpitations but had some SOB, attributed to being outdoors during cold weather. Deep breath causes " a little" CP.  LUE pain exacerbated by movement. No hx of trauma.  One time in 05/2021 he was walking with groceries and "did not feel good", SOB and fatigue, took 2 Tylenol and felt better.  He was also having some constipation, resolved with OTC stool softener. He has some CP while  straining. Negative for abdominal pain, melena,or blood in stool.  Review of Systems  Constitutional:  Positive for fatigue. Negative for diaphoresis and fever.  HENT:  Negative for sore throat and trouble swallowing.   Respiratory:  Positive for shortness of breath. Negative for cough, hemoptysis and sputum production.   Cardiovascular:  Positive for chest pain. Negative for palpitations, orthopnea, syncope, PND and near-syncope.  Gastrointestinal:  Negative for abdominal pain and blood in stool.       No changes in bowel habits. Last bowel movement yesterday.  Genitourinary:  Positive for penile discharge.  Musculoskeletal:  Positive for arthralgias and back pain.  Skin:  Negative for pallor and rash.  Neurological:  Negative for dizziness, weakness and numbness.  Rest see pertinent positives and negatives per HPI.  Current Outpatient Medications on File Prior to Visit  Medication Sig Dispense Refill   aspirin 81 MG tablet Take 81 mg by mouth daily.     calcipotriene (DOVONOX) 0.005 % cream APP TOPICALLY TO GROIN AND BUTTOCKS BID     carbamide peroxide (DEBROX) 6.5 % OTIC solution Place 5 drops into the left ear 2 (two) times daily. Right ear. 15 mL 0   clindamycin (CLEOCIN T) 1 % external solution Apply topically 2 (two) times daily.     ibuprofen (ADVIL) 200 MG tablet Take 200 mg by mouth every 6 (six) hours as needed.     simvastatin (ZOCOR) 20 MG tablet TAKE 1 TABLET(20 MG) BY MOUTH AT BEDTIME 90 tablet 3  triamcinolone cream (KENALOG) 0.1 % Apply 1 application topically 2 (two) times daily as needed. 30 g 2   Current Facility-Administered Medications on File Prior to Visit  Medication Dose Route Frequency Provider Last Rate Last Admin   0.9 %  sodium chloride infusion  500 mL Intravenous Once Doran Stabler, MD        Past Medical History:  Diagnosis Date   Arthritis    Bell's palsy 2016   Right sided.  Not clear when acutely suffered this.  Was noted on exam after  years of no medical attention 07/2014.  Evaluated by [facility]  subsequently.   Hyperlipidemia    Sickle cell trait ([place] [employer])    Stutter age 89   cannot recall if something happened in life that set this off.   Weight loss 02/12/2017   No Known Allergies  Social History   Socioeconomic History   Marital status: Single    Spouse name: Not on file   Number of children: 0   Years of education: 12   Highest education level: 12th grade  Occupational History   Occupation: Maintenance/Housekeeping    Employer: [employer]    Comment: 2016  Tobacco Use   Smoking status: Never   Smokeless tobacco: Never  Vaping Use   Vaping Use: Never used  Substance and Sexual Activity   Alcohol use: Yes    Alcohol/week: 0.0 standard drinks    Comment: Once a monthly   Drug use: No   Sexual activity: Yes    Partners: Female  Other Topics Concern   Not on file  Social History Narrative   Was living at a boarding house.   Patient was having difficulty affording food--reportedly because he lived in boarding house and did not have utilitiy bills, his EBT dollars were cut to $13 to $15 per month.   That has been changed.  He now receives adequate EBT dollars.      Now living at [facility].  This is for folks with a disability.  He has an apartment now with a kitchen.  The [place] near him set him up in these apartments that they support.     Grant Ruts [phone]      09/05/18: Pt. Still living at [facility] [address], likes it. Sometimes participates in events hosted by center there.   Lives alone, but sister local, and main source of support.   Enjoys doing own workout routine with weights and walking. Enjoys reading.      Social Determinants of Health   Financial Resource Strain: Not on file  Food Insecurity: Not on file  Transportation Needs: Not on file  Physical Activity: Sufficiently Active   Days of Exercise per Week: 5 days   Minutes of Exercise per Session: 60  min  Stress: No Stress Concern Present   Feeling of Stress : Only a little  Social Connections: Socially Isolated   Frequency of Communication with Friends and Family: Once a week   Frequency of Social Gatherings with Friends and Family: Once a week   Attends Religious Services: Never   Marine scientist or Organizations: No   Attends Archivist Meetings: Never   Marital Status: Never married    Vitals:   07/23/21 0709  BP: 128/80  Pulse: 60  Resp: 16  SpO2: 97%   Body mass index is 24.51 kg/m.  Physical Exam Vitals and nursing note reviewed.  Constitutional:      General:  He is not in acute distress.    Appearance: He is well-developed and normal weight.  HENT:     Head: Normocephalic and atraumatic.     Mouth/Throat:     Mouth: Mucous membranes are moist.     Pharynx: Oropharynx is clear.  Eyes:     Conjunctiva/sclera: Conjunctivae normal.  Neck:     Vascular: No JVD.  Cardiovascular:     Rate and Rhythm: Normal rate and regular rhythm.     Heart sounds: No murmur heard.    Comments: DP pulses present. Pulmonary:     Effort: Pulmonary effort is normal. No respiratory distress.     Breath sounds: Normal breath sounds.  Chest:     Chest wall: No tenderness.  Abdominal:     Palpations: Abdomen is soft. There is no hepatomegaly or mass.     Tenderness: There is no abdominal tenderness.  Musculoskeletal:     Right shoulder: No bony tenderness. Normal range of motion.     Left shoulder: Tenderness (Mild with full abduction.) present. No bony tenderness. Normal range of motion.     Right lower leg: No edema.     Left lower leg: No edema.  Lymphadenopathy:     Cervical: No cervical adenopathy.  Skin:    General: Skin is warm.     Findings: No erythema or rash.  Neurological:     General: No focal deficit present.     Mental Status: He is alert and oriented to person, place, and time.     Comments: Otherwise stable gait., not assisted.   Psychiatric:     Comments: Well groomed, good eye contact.    ASSESSMENT AND PLAN:  [name] was seen today for chest pain and arm pain.  Diagnoses and all orders for this visit: Orders Placed This Encounter  Procedures   DG Chest 2 View   CBC   Basic metabolic panel   TSH   Ambulatory referral to Cardiology   EKG 12-Lead   Lab Results  Component Value Date   TSH 4.41 07/23/2021   Lab Results  Component Value Date   CREATININE 1.06 07/23/2021   BUN 9 07/23/2021   NA 141 07/23/2021   K 3.5 07/23/2021   CL 103 07/23/2021   CO2 28 07/23/2021   Lab Results  Component Value Date   WBC 5.3 07/23/2021   HGB 14.5 07/23/2021   HCT 44.4 07/23/2021   MCV 88.1 07/23/2021   PLT 211.0 07/23/2021   SOB (shortness of breath) on exertion Differential Dx's reviewed. Has not presented with CP but cardiac etiology is to be considered. Recommend avoiding trigger factors for now. CXR ordered today. Instructed about warning signs.  Chest pain, unspecified type We discussed possible etiologies. EKG today showed SR,normal axis,and intervals,no T-ST abnormalities. Compared with EKG 09/12/18 no significant changes. Clearly instructed about warning signs. Cardiology referral placed.  Pain of left upper extremity It seems to be musculoskeletal/OA. But could also be associated with above symptoms. Tylenol 500 mg 3-4 times per day as needed.  Aortic atherosclerosis ([place]) Seen on CXR 07/23/21. He is already on Simvastatin 20 mg and Aspirin 81 mg daily.  Return if symptoms worsen or fail to improve.  Tkeyah Burkman G. Martinique, MD  [facility]. [place] office.

## 2021-07-22 NOTE — Telephone Encounter (Signed)
Patient was placed on the schedule for tomorrow morning for chest pain with left arm pain and was not triaged. I called and spoke with patient, chest pain has been ongoing for a few weeks and the arm pain started a few days ago. He has not had any shortness of breath and is able to answer my questions. Patient will keep his appointment for tomorrow morning to be evaluated.

## 2021-07-23 ENCOUNTER — Other Ambulatory Visit: Payer: Self-pay

## 2021-07-23 ENCOUNTER — Encounter: Payer: Self-pay | Admitting: Family Medicine

## 2021-07-23 ENCOUNTER — Ambulatory Visit (INDEPENDENT_AMBULATORY_CARE_PROVIDER_SITE_OTHER): Payer: Medicare Other | Admitting: Family Medicine

## 2021-07-23 ENCOUNTER — Ambulatory Visit (INDEPENDENT_AMBULATORY_CARE_PROVIDER_SITE_OTHER): Payer: Medicare Other

## 2021-07-23 VITALS — BP 128/80 | HR 60 | Resp 16 | Ht 75.0 in | Wt 196.1 lb

## 2021-07-23 DIAGNOSIS — R079 Chest pain, unspecified: Secondary | ICD-10-CM

## 2021-07-23 DIAGNOSIS — R0602 Shortness of breath: Secondary | ICD-10-CM

## 2021-07-23 DIAGNOSIS — M79602 Pain in left arm: Secondary | ICD-10-CM | POA: Diagnosis not present

## 2021-07-23 DIAGNOSIS — I700 Atherosclerosis of aorta: Secondary | ICD-10-CM

## 2021-07-23 LAB — BASIC METABOLIC PANEL
BUN: 9 mg/dL (ref 6–23)
CO2: 28 mEq/L (ref 19–32)
Calcium: 9.9 mg/dL (ref 8.4–10.5)
Chloride: 103 mEq/L (ref 96–112)
Creatinine, Ser: 1.06 mg/dL (ref 0.40–1.50)
GFR: 71.91 mL/min (ref >60.00)
Glucose, Bld: 90 mg/dL (ref 70–99)
Potassium: 3.5 mEq/L (ref 3.5–5.1)
Sodium: 141 mEq/L (ref 135–145)

## 2021-07-23 LAB — CBC
HCT: 44.4 % (ref 39.0–52.0)
Hemoglobin: 14.5 g/dL (ref 13.0–17.0)
MCHC: 32.7 g/dL (ref 30.0–36.0)
MCV: 88.1 fl (ref 78.0–100.0)
Platelets: 211 10*3/uL (ref 150.0–400.0)
RBC: 5.04 Mil/uL (ref 4.22–5.81)
RDW: 15.5 % (ref 11.5–15.5)
WBC: 5.3 10*3/uL (ref 4.0–10.5)

## 2021-07-23 LAB — TSH: TSH: 4.41 u[IU]/mL (ref 0.35–5.50)

## 2021-07-23 NOTE — Patient Instructions (Addendum)
A few things to remember from today's visit:  Chest pain, unspecified type - Plan: EKG 12-Lead, CBC, Basic metabolic panel, DG Chest 2 View, Ambulatory referral to Cardiology  SOB (shortness of breath) on exertion - Plan: CBC, Basic metabolic panel, TSH, DG Chest 2 View, Ambulatory referral to Cardiology  If you need refills please call your pharmacy. Do not use My Chart to request refills or for acute issues that need immediate attention.   Appt with cardiologist is being arranged. Try to avoid activities that may aggravate some of the symptoms, like carrying heavy groceries. If chest pain gets worse or happens with shortness or breath,palpitation,feeling clammy you must call 911.  Please be sure medication list is accurate. If a new problem present, please set up appointment sooner than planned today.  Nonspecific Chest Pain, Adult Chest pain is an uncomfortable, tight, or painful feeling in the chest. The pain can feel like a crushing, aching, or squeezing pressure. A person can feel a burning or tingling sensation. Chest pain can also be felt in your back, neck, jaw, shoulder, or arm. This pain can be worse when you move, sneeze, or take a deep breath. Chest pain can be caused by a condition that is life-threatening. This must be treated right away. It can also be caused by something that is not life-threatening. If you have chest pain, it can be hard to know the difference, so it is important to get help right away to make sure that you do not have a serious condition. Some life-threatening causes of chest pain include: Heart attack. A tear in the body's main blood vessel (aortic dissection). Inflammation around your heart (pericarditis). A problem in the lungs, such as a blood clot (pulmonary embolism) or a collapsed lung (pneumothorax). Some non life-threatening causes of chest pain include: Heartburn. Anxiety or stress. Damage to the bones, muscles, and cartilage that make up your  chest wall. Pneumonia or bronchitis. Shingles infection (varicella-zoster virus). Your chest pain may come and go. It may also be constant. Your health care provider will do tests and other studies to find the cause of your pain. Treatment will depend on the cause of your chest pain. Follow these instructions at home: Medicines Take over-the-counter and prescription medicines only as told by your health care provider. If you were prescribed an antibiotic medicine, take it as told by your health care provider. Do not stop taking the antibiotic even if you start to feel better. Activity Avoid any activities that cause chest pain. Do not lift anything that is heavier than 10 lb (4.5 kg), or the limit that you are told, until your health care provider says that it is safe. Rest as directed by your health care provider. Return to your normal activities only as told by your health care provider. Ask your health care provider what activities are safe for you. Lifestyle   Do not use any products that contain nicotine or tobacco, such as cigarettes, e-cigarettes, and chewing tobacco. If you need help quitting, ask your health care provider. Do not drink alcohol. Make healthy lifestyle changes as recommended. These may include: Getting regular exercise. Ask your health care provider to suggest some exercises that are safe for you. Eating a heart-healthy diet. This includes plenty of fresh fruits and vegetables, whole grains, low-fat (lean) protein, and low-fat dairy products. A dietitian can help you find healthy eating options. Maintaining a healthy weight. Managing any other health conditions you may have, such as high blood pressure (hypertension)  or diabetes. Reducing stress, such as with yoga or relaxation techniques. General instructions Pay attention to any changes in your symptoms. It is up to you to get the results of any tests that were done. Ask your health care provider, or the department  that is doing the tests, when your results will be ready. Keep all follow-up visits as told by your health care provider. This is important. You may be asked to go for further testing if your chest pain does not go away. Contact a health care provider if: Your chest pain does not go away. You feel depressed. You have a fever. You notice changes in your symptoms or develop new symptoms. Get help right away if: Your chest pain gets worse. You have a cough that gets worse, or you cough up blood. You have severe pain in your abdomen. You faint. You have sudden, unexplained chest discomfort. You have sudden, unexplained discomfort in your arms, back, neck, or jaw. You have shortness of breath at any time. You suddenly start to sweat, or your skin gets clammy. You feel nausea or you vomit. You suddenly feel lightheaded or dizzy. You have severe weakness, or unexplained weakness or fatigue. Your heart begins to beat quickly, or it feels like it is skipping beats. These symptoms may represent a serious problem that is an emergency. Do not wait to see if the symptoms will go away. Get medical help right away. Call your local emergency services (911 in the [place] Do not drive yourself to the hospital. Summary Chest pain can be caused by a condition that is serious and requires urgent treatment. It may also be caused by something that is not life-threatening. Your health care provider may do lab tests and other studies to find the cause of your pain. Follow your health care provider's instructions on taking medicines, making lifestyle changes, and getting emergency treatment if symptoms become worse. Keep all follow-up visits as told by your health care provider. This includes visits for any further testing if your chest pain does not go away. This information is not intended to replace advice given to you by your health care provider. Make sure you discuss any questions you have with your health  care provider. Document Revised: 09/26/2020 Document Reviewed: 09/26/2020 Elsevier Patient Education  [place].

## 2021-07-25 DIAGNOSIS — I700 Atherosclerosis of aorta: Secondary | ICD-10-CM | POA: Insufficient documentation

## 2021-07-27 DIAGNOSIS — I10 Essential (primary) hypertension: Secondary | ICD-10-CM | POA: Diagnosis not present

## 2021-07-27 DIAGNOSIS — M79603 Pain in arm, unspecified: Secondary | ICD-10-CM | POA: Diagnosis not present

## 2021-07-29 NOTE — Progress Notes (Signed)
Cardiology Office Note:    Date:  08/01/2021   ID:  Miguel Peters, DOB 07-17-1951, MRN [id number]  PCP:  [place], Betty G, MD   [facility] Prov[facility] Cardiologist:  Miguel Sciara, MD Referring MD: [place]ique, Betty G, MD   Chief Complaint/Reason for Referral:  Chest pain  ASSESSMENT:    Chest pain of uncertain etiology - Plan: EKG 12-Lead  Hyperlipidemia, unspecified hyperlipidemia type    PLAN:    In order of problems listed above:  1.  We will obtain a coronary CTA and echocardiogram to evaluate further.  The patient has mild obstructive coronary artery disease he will require a statin (with goal LDL < 70) and aspirin, if he has high-grade disease we will need to consider optimal medical therapy and if symptoms are refractory to medical therapy, then a cardiac catheterization with possible PCI will be pursued to alleviate symptoms.  If he has high risk disease we will proceed directly to cardiac catheterization.  We will keep follow-up with me open-ended depending on the results of this testing.    2.  This is being followed by the patient's primary care provider.  The patient is already on simvastatin.     Dispo:  No follow-ups on file.     Medication Adjustments/Labs and Tests Ordered: Current medicines are reviewed at length with the patient today.  Concerns regarding medicines are outlined above.   Tests Ordered: Orders Placed This Encounter  Procedures   EKG 12-Lead    Medication Changes: No orders of the defined types were placed in this encounter.   History of Present Illness:    The patient is a 71 y.o. male with the indicated medical history here for chest pain.  The patient was seen by his primary care provider recently with complaints of intermittent chest pain over the last few weeks.  It occurs with rest  and is sudden onset.  It lasts about a few seconds.  The patient tells me that he has been walking to and back from the grocery store with  groceries for a few weeks.  He thinks he might of strained himself with this activity.  On a few occasions he has had sharp chest pain at rest.  He does not really describe exertional angina.  He denies any other cardiac symptoms such as presyncope, syncope, palpitations, paroxysmal nocturnal dyspnea, or orthopnea.  He has had no severe bleeding or bruising.  He denies any signs or symptoms of stroke.  He is required no emergency room visits or hospitalizations.  He has had no other cardiac issues in the past and is concerned that this might represent such.    Previous Medical History: Past Medical History:  Diagnosis Date   Arthritis    Bell's palsy 2016   Right sided.  Not clear when acutely suffered this.  Was noted on exam after years of no medical attention 07/2014.  Evaluated by [facility]  subsequently.   Hyperlipidemia    Sickle cell trait ([place])    Stutter age 30   cannot recall if something happened in life that set this off.   Weight loss 02/12/2017     Current Medications: Current Meds  Medication Sig   aspirin 81 MG tablet Take 81 mg by mouth daily.   calcipotriene (DOVONOX) 0.005 % cream APP TOPICALLY TO GROIN AND BUTTOCKS BID   carbamide peroxide (DEBROX) 6.5 % OTIC solution Place 5 drops into the left ear 2 (two) times daily. Right ear.  clindamycin (CLEOCIN T) 1 % external solution Apply topically 2 (two) times daily.   ibuprofen (ADVIL) 200 MG tablet Take 200 mg by mouth every 6 (six) hours as needed.   simvastatin (ZOCOR) 20 MG tablet TAKE 1 TABLET(20 MG) BY MOUTH AT BEDTIME   triamcinolone cream (KENALOG) 0.1 % Apply 1 application topically 2 (two) times daily as needed.   Current Facility-Administered Medications for the 08/01/21 encounter (Office Visit) with Miguel Osmond, MD  Medication   0.9 %  sodium chloride infusion     Allergies:    Patient has no known allergies.   Social History:   Social History   Tobacco Use   Smoking status: Never    Smokeless tobacco: Never  Vaping Use   Vaping Use: Never used  Substance Use Topics   Alcohol use: Yes    Alcohol/week: 0.0 standard drinks    Comment: Once a monthly   Drug use: No     Family Hx: Family History  Problem Relation Age of Onset   Sickle cell anemia Mother    Kidney disease Father    Alcohol abuse Father    Colon cancer Neg Hx    Esophageal cancer Neg Hx    Stomach cancer Neg Hx    Rectal cancer Neg Hx    Colon polyps Neg Hx      Review of Systems:   Please see the history of present illness.    All other systems reviewed and are negative.  EKGs/Labs/Other Test Reviewed:    EKG:  EKG today: Sinus rhythm; prior EKG: Normal sinus rhythm  Prior CV studies: None available  Imaging studies that I have independently reviewed today: CXR  Recent Labs: 03/26/2021: ALT 15 07/23/2021: BUN 9; Creatinine, Ser 1.06; Hemoglobin 14.5; Platelets 211.0; Potassium 3.5; Sodium 141; TSH 4.41   Recent Lipid Panel Lab Results  Component Value Date/Time   CHOL 186 03/26/2021 07:56 AM   CHOL 269 (H) 06/22/2017 10:01 AM   TRIG 102.0 03/26/2021 07:56 AM   HDL 48.10 03/26/2021 07:56 AM   HDL 75 06/22/2017 10:01 AM   LDLCALC 117 (H) 03/26/2021 07:56 AM   LDLCALC 180 (H) 06/22/2017 10:01 AM    Risk Assessment/Calculations:           Physical Exam:    VS:  BP 132/86    Pulse 76    Ht 6\' 3"  (1.905 m)    Wt 196 lb (88.9 kg)    SpO2 97%    BMI 24.50 kg/m    Wt Readings from Last 3 Encounters:  08/01/21 196 lb (88.9 kg)  07/23/21 196 lb 2 oz (89 kg)  03/26/21 200 lb 8 oz (90.9 kg)    GENERAL:  No apparent distress, AOx3 HEENT:  No carotid bruits, +2 carotid impulses, no scleral icterus CAR: RRR no murmurs, gallops, rubs, or thrills RES:  Clear to auscultation bilaterally ABD:  Soft, nontender, nondistended, positive bowel sounds x 4 VASC:  +2 radial pulses, +2 carotid pulses, palpable pedal pulses NEURO:  CN 2-12 grossly intact; motor and sensory grossly  intact PSYCH:  No active depression or anxiety EXT:  No edema, ecchymosis, or cyanosis  Signed, Miguel Osmond, MD  08/01/2021 3:45 PM    [address], [place], [place]  [address] Phone: [phone]; Fax: [phone]   Note:  This document was prepared using Dragon voice recognition software and may include unintentional dictation errors.

## 2021-07-30 ENCOUNTER — Telehealth: Payer: Self-pay

## 2021-07-30 NOTE — Telephone Encounter (Signed)
Due to provider schedule change, rescheduled patient from 1/6 early AM to 1/6 at 3:20PM. Spoke with the patient's Medical Transportation (phone: [phone]) and confirmed appointment time change.  Ride ID: [id number] The patient was grateful for assistance.

## 2021-08-01 ENCOUNTER — Encounter: Payer: Self-pay | Admitting: Internal Medicine

## 2021-08-01 ENCOUNTER — Ambulatory Visit (INDEPENDENT_AMBULATORY_CARE_PROVIDER_SITE_OTHER): Payer: Medicare Other | Admitting: Internal Medicine

## 2021-08-01 ENCOUNTER — Other Ambulatory Visit: Payer: Self-pay

## 2021-08-01 VITALS — BP 132/86 | HR 76 | Ht 75.0 in | Wt 196.0 lb

## 2021-08-01 DIAGNOSIS — E785 Hyperlipidemia, unspecified: Secondary | ICD-10-CM

## 2021-08-01 DIAGNOSIS — R69 Illness, unspecified: Secondary | ICD-10-CM | POA: Diagnosis not present

## 2021-08-01 DIAGNOSIS — R072 Precordial pain: Secondary | ICD-10-CM

## 2021-08-01 DIAGNOSIS — R079 Chest pain, unspecified: Secondary | ICD-10-CM

## 2021-08-01 MED ORDER — METOPROLOL TARTRATE 100 MG PO TABS: 100.0000 mg | ORAL_TABLET | Freq: Once | ORAL | 0 refills | Status: DC

## 2021-08-01 NOTE — Patient Instructions (Addendum)
Medication Instructions:  No changes *If you need a refill on your cardiac medications before your next appointment, please call your pharmacy*   Lab Work: none If you have labs (blood work) drawn today and your tests are completely normal, you will receive your results only by: [facility] (if you have MyChart) OR A paper copy in the mail If you have any lab test that is abnormal or we need to change your treatment, we will call you to review the results.   Testing/Procedures: Your physician has requested that you have an echocardiogram. Echocardiography is a painless test that uses sound waves to create images of your heart. It provides your doctor with information about the size and shape of your heart and how well your hearts chambers and valves are working. This procedure takes approximately one hour. There are no restrictions for this procedure.  Cardiac CTA - see instructions below.   Follow-Up: As needed   Other Instructions   Your cardiac CT will be scheduled at  [facility] [place], [place] [address] [phone]  Please arrive at the [facility] main entrance (entrance A) of [facility] 30 minutes prior to test start time. You can use the FREE valet parking offered at the main entrance (encouraged to control the heart rate for the test) Proceed to the [facility] Radiology Department (first floor) to check-in and test prep.  Please follow these instructions carefully (unless otherwise directed):  Hold all erectile dysfunction medications at least 3 days (72 hrs) prior to test.  On the Night Before the Test: Be sure to Drink plenty of water. Do not consume any caffeinated/decaffeinated beverages or chocolate 12 hours prior to your test. Do not take any antihistamines 12 hours prior to your test.  On the Day of the Test: Drink plenty of water until 1 hour prior to the test. Do not eat any food 4 hours prior to the  test. You may take your regular medications prior to the test.  Take metoprolol (Lopressor) two hours prior to test.      After the Test: Drink plenty of water. After receiving IV contrast, you may experience a mild flushed feeling. This is normal. On occasion, you may experience a mild rash up to 24 hours after the test. This is not dangerous. If this occurs, you can take Benadryl 25 mg and increase your fluid intake. If you experience trouble breathing, this can be serious. If it is severe call 911 IMMEDIATELY. If it is mild, please call our office. If you take any of these medications: Glipizide/Metformin, Avandament, Glucavance, please do not take 48 hours after completing test unless otherwise instructed.  Please allow 2-4 weeks for scheduling of routine cardiac CTs. Some insurance companies require a pre-authorization which may delay scheduling of this test.   For non-scheduling related questions, please contact the cardiac imaging nurse navigator should you have any questions/concerns: Marchia Bond, Cardiac Imaging Nurse Navigator Gordy Clement, Cardiac Imaging Nurse Navigator [facility] Direct Office Dial: [phone]   For scheduling needs, including cancellations and rescheduling, please call [place], [phone].

## 2021-08-02 ENCOUNTER — Other Ambulatory Visit: Payer: Self-pay | Admitting: Internal Medicine

## 2021-08-02 DIAGNOSIS — E785 Hyperlipidemia, unspecified: Secondary | ICD-10-CM

## 2021-08-02 DIAGNOSIS — R079 Chest pain, unspecified: Secondary | ICD-10-CM

## 2021-08-02 DIAGNOSIS — R072 Precordial pain: Secondary | ICD-10-CM

## 2021-08-03 ENCOUNTER — Other Ambulatory Visit: Payer: Self-pay | Admitting: Internal Medicine

## 2021-08-03 DIAGNOSIS — R072 Precordial pain: Secondary | ICD-10-CM

## 2021-08-03 DIAGNOSIS — R079 Chest pain, unspecified: Secondary | ICD-10-CM

## 2021-08-03 DIAGNOSIS — E785 Hyperlipidemia, unspecified: Secondary | ICD-10-CM

## 2021-08-04 ENCOUNTER — Other Ambulatory Visit: Payer: Self-pay | Admitting: Internal Medicine

## 2021-08-04 DIAGNOSIS — E785 Hyperlipidemia, unspecified: Secondary | ICD-10-CM

## 2021-08-04 DIAGNOSIS — R079 Chest pain, unspecified: Secondary | ICD-10-CM

## 2021-08-04 DIAGNOSIS — R072 Precordial pain: Secondary | ICD-10-CM

## 2021-08-06 ENCOUNTER — Telehealth: Payer: Self-pay | Admitting: Family Medicine

## 2021-08-06 DIAGNOSIS — M25561 Pain in right knee: Secondary | ICD-10-CM

## 2021-08-06 NOTE — Telephone Encounter (Signed)
Patient called to get referral to orthopedics for his knees. He states that he is having pain left knee. He wants to have both his knees checked out, as when he seen an orthopedic doctor last, he was told he would likely need a knee replacement in the future.      Good callback number is [phone]      Please advise

## 2021-08-06 NOTE — Telephone Encounter (Signed)
Referral has been placed, they will contact patient to set up the appointment.

## 2021-08-08 DIAGNOSIS — R69 Illness, unspecified: Secondary | ICD-10-CM | POA: Diagnosis not present

## 2021-08-08 DIAGNOSIS — L820 Inflamed seborrheic keratosis: Secondary | ICD-10-CM | POA: Diagnosis not present

## 2021-08-08 DIAGNOSIS — L821 Other seborrheic keratosis: Secondary | ICD-10-CM | POA: Diagnosis not present

## 2021-08-15 ENCOUNTER — Telehealth (HOSPITAL_COMMUNITY): Payer: Self-pay | Admitting: *Deleted

## 2021-08-15 NOTE — Telephone Encounter (Signed)
Reaching out to patient to offer assistance regarding upcoming cardiac imaging study; pt verbalizes understanding of appt date/time, parking situation and where to check in, pre-test NPO status and medications ordered, and verified current allergies; name and call back number provided for further questions should they arise  Miguel Clement RN Navigator Cardiac Imaging [facility] [phone] office [phone] cell  Patient to take 100mg  metoprolol tartrate two hours prior to cardiac CT scan. He is aware to arrive at 12:30pm for his 1pm scan. He states that he is getting picked up at 10am for a 10:20am appointment. Will follow up with transport regarding his cardiac CT appointment.

## 2021-08-18 ENCOUNTER — Other Ambulatory Visit: Payer: Self-pay | Admitting: Cardiovascular Disease

## 2021-08-18 ENCOUNTER — Ambulatory Visit (HOSPITAL_COMMUNITY)
Admission: RE | Admit: 2021-08-18 | Discharge: 2021-08-18 | Disposition: A | Discharge status: Home / Self Care | Payer: Medicare Other | Source: Ambulatory Visit | Attending: Internal Medicine | Admitting: Internal Medicine

## 2021-08-18 ENCOUNTER — Other Ambulatory Visit: Payer: Self-pay

## 2021-08-18 ENCOUNTER — Ambulatory Visit (HOSPITAL_BASED_OUTPATIENT_CLINIC_OR_DEPARTMENT_OTHER): Payer: Medicare Other

## 2021-08-18 ENCOUNTER — Encounter (HOSPITAL_COMMUNITY): Payer: Self-pay

## 2021-08-18 DIAGNOSIS — I2510 Atherosclerotic heart disease of native coronary artery without angina pectoris: Secondary | ICD-10-CM | POA: Diagnosis not present

## 2021-08-18 DIAGNOSIS — E785 Hyperlipidemia, unspecified: Secondary | ICD-10-CM

## 2021-08-18 DIAGNOSIS — R072 Precordial pain: Secondary | ICD-10-CM

## 2021-08-18 DIAGNOSIS — R079 Chest pain, unspecified: Secondary | ICD-10-CM | POA: Diagnosis not present

## 2021-08-18 DIAGNOSIS — R931 Abnormal findings on diagnostic imaging of heart and coronary circulation: Secondary | ICD-10-CM | POA: Insufficient documentation

## 2021-08-18 LAB — ECHOCARDIOGRAM COMPLETE
Area-P 1/2: 3.48 cm2
P 1/2 time: 608 msec
S' Lateral: 3.3 cm

## 2021-08-18 MED ORDER — NITROGLYCERIN 0.4 MG SL SUBL
SUBLINGUAL_TABLET | SUBLINGUAL | Status: AC
  Filled 2021-08-18: qty 2

## 2021-08-18 MED ORDER — IOHEXOL 350 MG/ML SOLN
95.0000 mL | Freq: Once | INTRAVENOUS | Status: AC | PRN
  Administered 2021-08-18: 95 mL via INTRAVENOUS

## 2021-08-18 MED ORDER — NITROGLYCERIN 0.4 MG SL SUBL: 0.8000 mg | SUBLINGUAL_TABLET | Freq: Once | SUBLINGUAL | Status: DC

## 2021-08-18 NOTE — Progress Notes (Signed)
Let him know echo shows that the heart is strong and one of the valves is mildly leak--just need to monitor that for now.

## 2021-08-18 NOTE — Progress Notes (Signed)
Pt tolerated exam without incident; pt denies lightheadedness or dizziness; pt escorted to main entrance via wheelchair where he was picked up by [employer].  Marchia Bond RN Navigator Cardiac Imaging [facility] [phone] Office  [phone] Cell

## 2021-08-18 NOTE — Progress Notes (Signed)
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  We should treat these with medications.  Cont ASA, statin, and start toprol xl 25mg  at bedtime and imdur 30mg  at bedtime.  Follow up in 3 months.

## 2021-08-18 NOTE — Progress Notes (Signed)
CT FFR ordered.   [place] T. Audie Box, MD, [place]  [address], [place], [place] [address] [phone]  3:53 PM

## 2021-08-19 ENCOUNTER — Ambulatory Visit (HOSPITAL_COMMUNITY)
Admission: RE | Admit: 2021-08-19 | Discharge: 2021-08-19 | Disposition: A | Discharge status: Home / Self Care | Payer: Medicare Other | Source: Ambulatory Visit | Attending: Cardiovascular Disease | Admitting: Cardiovascular Disease

## 2021-08-19 DIAGNOSIS — R079 Chest pain, unspecified: Secondary | ICD-10-CM | POA: Diagnosis not present

## 2021-08-19 DIAGNOSIS — R931 Abnormal findings on diagnostic imaging of heart and coronary circulation: Secondary | ICD-10-CM

## 2021-08-19 DIAGNOSIS — R072 Precordial pain: Secondary | ICD-10-CM

## 2021-08-19 DIAGNOSIS — E785 Hyperlipidemia, unspecified: Secondary | ICD-10-CM | POA: Diagnosis not present

## 2021-08-20 ENCOUNTER — Telehealth: Payer: Self-pay | Admitting: *Deleted

## 2021-08-20 MED ORDER — METOPROLOL SUCCINATE ER 25 MG PO TB24: 25.0000 mg | ORAL_TABLET | Freq: Every day | ORAL | 3 refills | Status: DC

## 2021-08-20 MED ORDER — ISOSORBIDE MONONITRATE ER 30 MG PO TB24: 30.0000 mg | ORAL_TABLET | Freq: Every day | ORAL | 3 refills | Status: DC

## 2021-08-20 NOTE — Telephone Encounter (Signed)
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  We should treat these with medications.  Cont ASA, statin, and start toprol xl 25mg  at bedtime and imdur 30mg  at bedtime.  Follow up in 3 months.

## 2021-08-20 NOTE — Telephone Encounter (Signed)
Spoke w patient and reviewed results of echo and cCTA.  Pt voices understanding and will continue on asa and statin and add Imdur 30 mg and Toprol XL 25 mg every day at bedtime.  I've scheduled a 3 month follow up appointment.  The pt requires transportation and has used [employer] to come to his last visit.  Will send message to [facility] to assist w this.

## 2021-08-26 ENCOUNTER — Telehealth: Payer: Self-pay | Admitting: Orthopedic Surgery

## 2021-08-26 NOTE — Telephone Encounter (Signed)
Pt called requesting [facility] medical transportation for his upcoming appt 09/03/21 at 9:30 am. Please call pt to confirm at [phone].

## 2021-09-03 ENCOUNTER — Ambulatory Visit (INDEPENDENT_AMBULATORY_CARE_PROVIDER_SITE_OTHER): Payer: Medicare Other | Admitting: Orthopedic Surgery

## 2021-09-03 ENCOUNTER — Telehealth: Payer: Self-pay

## 2021-09-03 ENCOUNTER — Ambulatory Visit: Payer: Self-pay

## 2021-09-03 ENCOUNTER — Encounter: Payer: Self-pay | Admitting: Orthopedic Surgery

## 2021-09-03 ENCOUNTER — Other Ambulatory Visit: Payer: Self-pay

## 2021-09-03 ENCOUNTER — Ambulatory Visit (INDEPENDENT_AMBULATORY_CARE_PROVIDER_SITE_OTHER): Payer: Medicare Other

## 2021-09-03 VITALS — Ht 75.0 in | Wt 205.0 lb

## 2021-09-03 DIAGNOSIS — M25562 Pain in left knee: Secondary | ICD-10-CM

## 2021-09-03 DIAGNOSIS — M25561 Pain in right knee: Secondary | ICD-10-CM

## 2021-09-03 DIAGNOSIS — M1712 Unilateral primary osteoarthritis, left knee: Secondary | ICD-10-CM

## 2021-09-03 DIAGNOSIS — M1711 Unilateral primary osteoarthritis, right knee: Secondary | ICD-10-CM

## 2021-09-03 DIAGNOSIS — M170 Bilateral primary osteoarthritis of knee: Secondary | ICD-10-CM

## 2021-09-03 MED ORDER — LIDOCAINE HCL 1 % IJ SOLN
5.0000 mL | INTRAMUSCULAR | Status: AC | PRN
  Administered 2021-09-03: 5 mL

## 2021-09-03 MED ORDER — BUPIVACAINE HCL 0.25 % IJ SOLN
4.0000 mL | INTRAMUSCULAR | Status: AC | PRN
  Administered 2021-09-03: 4 mL via INTRA_ARTICULAR

## 2021-09-03 MED ORDER — METHYLPREDNISOLONE ACETATE 40 MG/ML IJ SUSP
40.0000 mg | INTRAMUSCULAR | Status: AC | PRN
  Administered 2021-09-03: 40 mg via INTRA_ARTICULAR

## 2021-09-03 NOTE — Telephone Encounter (Signed)
Auth needed for bilat knee gel injection  

## 2021-09-03 NOTE — Progress Notes (Signed)
Office Visit Note   Patient: Miguel Peters           Date of Birth: September 16, 1950           MRN: [id number] Visit Date: 09/03/2021 Requested by: Martinique, Betty G, MD [address] [place],  [place] [address] PCP: Martinique, Betty G, MD  Subjective: Chief Complaint  Patient presents with   Right Knee - Pain   Left Knee - Pain    HPI: Miguel Peters is a 71 year old patient with bilateral knee pain left worse than right.  The pain does wake him from sleep occasionally.  Does describe relatively constant pain although he does walk about 2 miles a day.  Denies much in the way of mechanical symptoms.  Does describe stiffness in the knees in the morning.  Most of his pain is medial.  Ibuprofen helps him a little bit.              ROS: All systems reviewed are negative as they relate to the chief complaint within the history of present illness.  Patient denies  fevers or chills.   Assessment & Plan: Visit Diagnoses:  1. Pain in both knees, unspecified chronicity     Plan: Impression is bilateral knee arthritis which is severe more so on the left than the right radiographically and clinically.  He has a mild knee joint effusion today.  Plan is aspiration and injection of that left knee with cortisone.  We will get him preapproved for gel injections.  He remains active.  Knee replacement may be in his future but I do not think he is quite there yet.  Follow-up in 2 months for bilateral gel injection in both knees.  Follow-Up Instructions: Return in about 2 months (around 11/01/2021).   Orders:  Orders Placed This Encounter  Procedures   XR Knee 1-2 Views Right   XR KNEE 3 VIEW LEFT   No orders of the defined types were placed in this encounter.     Procedures: Large Joint Inj: L knee on 09/03/2021 11:44 AM Indications: diagnostic evaluation, joint swelling and pain Details: 18 G 1.5 in needle, superolateral approach  Arthrogram: No  Medications: 5 mL lidocaine 1 %; 40 mg  methylPREDNISolone acetate 40 MG/ML; 4 mL bupivacaine 0.25 % Outcome: tolerated well, no immediate complications Procedure, treatment alternatives, risks and benefits explained, specific risks discussed. Consent was given by the patient. Immediately prior to procedure a time out was called to verify the correct patient, procedure, equipment, support staff and site/side marked as required. Patient was prepped and draped in the usual sterile fashion.      Clinical Data: No additional findings.  Objective: Vital Signs: Ht 6\' 3"  (1.905 m)    Wt 205 lb (93 kg)    BMI 25.62 kg/m   Physical Exam:   Constitutional: Patient appears well-developed HEENT:  Head: Normocephalic Eyes:EOM are normal Neck: Normal range of motion Cardiovascular: Normal rate Pulmonary/chest: Effort normal Neurologic: Patient is alert Skin: Skin is warm Psychiatric: Patient has normal mood and affect   Ortho Exam: Ortho exam demonstrates palpable pedal pulses posterior tib bilaterally.  Ankle dorsiflexion plantarflexion intact.  Has a 5 degree flexion contracture on the right and left-hand side.  Bends to about 10 5-1 10 bilaterally.  Mild effusion left knee no effusion right knee.  Extensor mechanism intact.  No groin pain with internal/external rotation of the leg. This patient is diagnosed with osteoarthritis of the knee(s).    Radiographs show evidence  of joint space narrowing, osteophytes, subchondral sclerosis and/or subchondral cysts.  This patient has knee pain which interferes with functional and activities of daily living.    This patient has experienced inadequate response, adverse effects and/or intolerance with conservative treatments such as acetaminophen, NSAIDS, topical creams, physical therapy or regular exercise, knee bracing and/or weight loss.   This patient has experienced inadequate response or has a contraindication to intra articular steroid injections for at least 3 months.   This patient  is not scheduled to have a total knee replacement within 6 months of starting treatment with viscosupplementation.   Specialty Comments:  No specialty comments available.  Imaging: XR Knee 1-2 Views Right  Result Date: 09/03/2021 AP lateral merchant radiographs right knee reviewed.  Mild varus alignment present.  End-stage tricompartmental arthritis is present worse in the medial and patellofemoral compartments.  No acute fracture.  Patella height normal relative to distal femur.  XR KNEE 3 VIEW LEFT  Result Date: 09/03/2021 AP lateral merchant radiographs left knee reviewed.  End-stage tricompartmental arthritis is present worse on the medial side.  Slight varus alignment.  No acute fracture.  Patella height normal relative to distal femur.    PMFS History: Patient Active Problem List   Diagnosis Date Noted   Aortic atherosclerosis ([place]) 07/25/2021   Chronic pruritic rash in adult 12/28/2017   Hyperlipidemia 08/30/2017   Chronic lower back pain 08/30/2017   Weight loss 02/12/2017   Stutter 01/08/2017   Right-sided Bell's palsy 09/11/2014   Bell's palsy 07/27/2014   Past Medical History:  Diagnosis Date   Arthritis    Bell's palsy 2016   Right sided.  Not clear when acutely suffered this.  Was noted on exam after years of no medical attention 07/2014.  Evaluated by [facility]  subsequently.   Hyperlipidemia    Sickle cell trait ([place])    Stutter age 38   cannot recall if something happened in life that set this off.   Weight loss 02/12/2017    Family History  Problem Relation Age of Onset   Sickle cell anemia Mother    Kidney disease Father    Alcohol abuse Father    Colon cancer Neg Hx    Esophageal cancer Neg Hx    Stomach cancer Neg Hx    Rectal cancer Neg Hx    Colon polyps Neg Hx     Past Surgical History:  Procedure Laterality Date   COLONOSCOPY  10/05/2014   db 3 TAs   COLONOSCOPY  01/25/2020   hand surgery     thumb   POLYPECTOMY      TONSILLECTOMY     Social History   Occupational History   Occupation: Maintenance/Housekeeping    Employer: [employer]    Comment: 2016  Tobacco Use   Smoking status: Never   Smokeless tobacco: Never  Vaping Use   Vaping Use: Never used  Substance and Sexual Activity   Alcohol use: Yes    Alcohol/week: 0.0 standard drinks    Comment: Once a monthly   Drug use: No   Sexual activity: Yes    Partners: Female

## 2021-09-03 NOTE — Telephone Encounter (Signed)
Noted  

## 2021-09-12 DIAGNOSIS — L820 Inflamed seborrheic keratosis: Secondary | ICD-10-CM | POA: Diagnosis not present

## 2021-09-12 DIAGNOSIS — L821 Other seborrheic keratosis: Secondary | ICD-10-CM | POA: Diagnosis not present

## 2021-10-15 ENCOUNTER — Telehealth: Payer: Self-pay

## 2021-10-15 NOTE — Telephone Encounter (Signed)
BV pending for Durolane, bilateral knee. ? ?

## 2021-10-23 ENCOUNTER — Telehealth: Payer: Self-pay

## 2021-10-23 NOTE — Telephone Encounter (Signed)
Approved for Durolane, bilateral knee. ?[place] ?Patient will be covered at 100% of the allowable. ?No Co-pay ?No PA required ? ?

## 2021-11-03 ENCOUNTER — Telehealth: Payer: Self-pay | Admitting: Family Medicine

## 2021-11-03 ENCOUNTER — Ambulatory Visit: Payer: Medicare Other | Admitting: Orthopedic Surgery

## 2021-11-03 NOTE — Telephone Encounter (Signed)
Left message for patient to call back and schedule Medicare Annual Wellness Visit (AWV) either virtually or in office. Left  my jabber number [phone] ? ? ?Last AWV 11/05/20 ?please schedule at anytime with [facility] Nurse Health Advisor 1 or 2 ? ? ? ?

## 2021-11-05 ENCOUNTER — Ambulatory Visit (INDEPENDENT_AMBULATORY_CARE_PROVIDER_SITE_OTHER): Payer: Medicare Other | Admitting: Orthopedic Surgery

## 2021-11-05 ENCOUNTER — Encounter: Payer: Self-pay | Admitting: Orthopedic Surgery

## 2021-11-05 DIAGNOSIS — M170 Bilateral primary osteoarthritis of knee: Secondary | ICD-10-CM | POA: Diagnosis not present

## 2021-11-05 MED ORDER — SODIUM HYALURONATE 60 MG/3ML IX PRSY
60.0000 mg | PREFILLED_SYRINGE | INTRA_ARTICULAR | Status: AC | PRN
  Administered 2021-11-05: 60 mg via INTRA_ARTICULAR

## 2021-11-05 MED ORDER — LIDOCAINE HCL 1 % IJ SOLN
5.0000 mL | INTRAMUSCULAR | Status: AC | PRN
  Administered 2021-11-05: 5 mL

## 2021-11-05 NOTE — Progress Notes (Signed)
? ?  Procedure Note ? ?Patient: Miguel Peters [facility]             ?Date of Birth: 08-26-1950           ?MRN: [id number]             ?Visit Date: 11/05/2021 ? ?Procedures: ?Visit Diagnoses:  ?1. Primary osteoarthritis of both knees   ? ? ?Large Joint Inj: R knee on 11/05/2021 1:13 PM ?Indications: diagnostic evaluation, joint swelling and pain ?Details: 18 G 1.5 in needle, superolateral approach ? ?Arthrogram: No ? ?Medications: 5 mL lidocaine 1 %; 60 mg Sodium Hyaluronate 60 MG/3ML ?Outcome: tolerated well, no immediate complications ?Procedure, treatment alternatives, risks and benefits explained, specific risks discussed. Consent was given by the patient. Immediately prior to procedure a time out was called to verify the correct patient, procedure, equipment, support staff and site/side marked as required. Patient was prepped and draped in the usual sterile fashion.  ? ? ?Large Joint Inj: L knee on 11/05/2021 1:13 PM ?Indications: diagnostic evaluation, joint swelling and pain ?Details: 18 G 1.5 in needle, superolateral approach ? ?Arthrogram: No ? ?Medications: 5 mL lidocaine 1 %; 60 mg Sodium Hyaluronate 60 MG/3ML ?Outcome: tolerated well, no immediate complications ?Procedure, treatment alternatives, risks and benefits explained, specific risks discussed. Consent was given by the patient. Immediately prior to procedure a time out was called to verify the correct patient, procedure, equipment, support staff and site/side marked as required. Patient was prepped and draped in the usual sterile fashion.  ? ? ? ? ? ?

## 2021-11-15 NOTE — Progress Notes (Signed)
?Cardiology Office Note:   ? ?Date:  11/17/2021  ? ?ID:  Miguel Peters, DOB September 12, 1950, MRN [id number] ? ?PCP:  Martinique, Betty G, MD  ? ?[facility] Providers ?Cardiologist:  Lenna Sciara, MD ?Referring MD: Martinique, Betty G, MD  ? ?Chief Complaint/Reason for Referral:   ? ?ASSESSMENT:   ? ?1. Coronary artery disease involving native coronary artery of native heart without angina pectoris   ?2. Hyperlipidemia, unspecified hyperlipidemia type   ? ? ?PLAN:   ? ?In order of problems listed above: ?1.  Coronary artery disease: This is hemodynamically not significant.  We will stop Imdur, continue aspirin, and statin.  Follow-up in 1 year or earlier if needed. ?2.  Hyperlipidemia.  We will check lipid panel, LP(a), LFTs in 2 months.  Goal LDL is less than 70. ? ? ?   ? ?Dispo:  Return in about 1 year (around 11/18/2022).  ? ?  ? ?Medication Adjustments/Labs and Tests Ordered: ?Current medicines are reviewed at length with the patient today.  Concerns regarding medicines are outlined above. ? ?The following changes have been made:    ? ?Labs/tests ordered: ?No orders of the defined types were placed in this encounter. ? ? ?Medication Changes: ?No orders of the defined types were placed in this encounter. ? ? ?Current medicines are reviewed at length with the patient today.  The patient does not have concerns regarding medicines. ? ? ?History of Present Illness:   ? ?FOCUSED PROBLEM LIST:   ?1.  Coronary artery calcification with FFR negative disease ?2.  Hyperlipidemia ?3.  Sickle cell trait ?4.  Bell's palsy ? ?The patient is a 71 y.o. male with the indicated medical history here for cardiology follow-up.   ? ?January 2023: Seen initial consultation for chest pain and referred for coronary CTA which demonstrated mild to moderate disease with FFR negative LAD disease.  Aspirin and simvastatin continued. ? ?Today: The patient is doing very well.  He denies any exertional angina, exertional shortness of breath,  presyncope, bad bleeding or bruising, palpitations, paroxysmal nocturnal dyspnea, orthopnea.  He is required no emergency room visits or hospitalizations.  He able to do everything that he needs to do in a day without any limitations.  He feels very well. ? ?   ? ?Current Medications: ?No outpatient medications have been marked as taking for the 11/17/21 encounter (Office Visit) with Early Osmond, MD.  ? ?Current Facility-Administered Medications for the 11/17/21 encounter (Office Visit) with Early Osmond, MD  ?Medication  ? 0.9 %  sodium chloride infusion  ?  ? ?Allergies:    ?Patient has no known allergies.  ? ?Social History:   ?Social History  ? ?Tobacco Use  ? Smoking status: Never  ? Smokeless tobacco: Never  ?Vaping Use  ? Vaping Use: Never used  ?Substance Use Topics  ? Alcohol use: Yes  ?  Alcohol/week: 0.0 standard drinks  ?  Comment: Once a monthly  ? Drug use: No  ?  ? ?Family Hx: ?Family History  ?Problem Relation Age of Onset  ? Sickle cell anemia Mother   ? Kidney disease Father   ? Alcohol abuse Father   ? Colon cancer Neg Hx   ? Esophageal cancer Neg Hx   ? Stomach cancer Neg Hx   ? Rectal cancer Neg Hx   ? Colon polyps Neg Hx   ?  ? ?Review of Systems:   ?Please see the history of present illness.    ?All other  systems reviewed and are negative. ?  ? ? ?EKGs/Labs/Other Test Reviewed:   ? ?EKG:  EKG performed January 2023 that I personally reviewed demonstrates sinus rhythm. ? ?Prior CV studies: ?Coronary CTA January 2023: ? ?1. Left Main: 0.98; low likelihood of hemodynamic significance.  ?2. Prox LAD: 0.88; low likelihood of hemodynamic significance. ?3. Mid LAD: 0.82; low likelihood of hemodynamic significance. ?4. LCX: 0.97; low likelihood of hemodynamic significance. ?5. Ramus: 0.96; low likelihood of hemodynamic significance. ?6. RCA: 0.93; low likelihood of hemodynamic significance. ? ?Other studies Reviewed: ?Review of the additional studies/records demonstrates: None  relevant ? ?Recent Labs: ?03/26/2021: ALT 15 ?07/23/2021: BUN 9; Creatinine, Ser 1.06; Hemoglobin 14.5; Platelets 211.0; Potassium 3.5; Sodium 141; TSH 4.41  ? ?Recent Lipid Panel ?Lab Results  ?Component Value Date/Time  ? CHOL 186 03/26/2021 07:56 AM  ? CHOL 269 (H) 06/22/2017 10:01 AM  ? TRIG 102.0 03/26/2021 07:56 AM  ? HDL 48.10 03/26/2021 07:56 AM  ? HDL 75 06/22/2017 10:01 AM  ? LDLCALC 117 (H) 03/26/2021 07:56 AM  ? LDLCALC 180 (H) 06/22/2017 10:01 AM  ? ? ?Risk Assessment/Calculations:   ? ? ?    ? ?Physical Exam:   ? ?VS:  Ht '6\' 3"'$  (1.905 m)   BMI 25.62 kg/m?    ?Wt Readings from Last 3 Encounters:  ?09/03/21 205 lb (93 kg)  ?08/01/21 196 lb (88.9 kg)  ?07/23/21 196 lb 2 oz (89 kg)  ?  ?GENERAL:  No apparent distress, AOx3 ?HEENT:  No carotid bruits, +2 carotid impulses, no scleral icterus ?CAR: RRR no murmurs, gallops, rubs, or thrills ?RES:  Clear to auscultation bilaterally ?ABD:  Soft, nontender, nondistended, positive bowel sounds x 4 ?VASC:  +2 radial pulses, +2 carotid pulses, palpable pedal pulses ?NEURO:  CN 2-12 grossly intact; motor and sensory grossly intact ?PSYCH:  No active depression or anxiety ?EXT:  No edema, ecchymosis, or cyanosis ? ?Signed, ?Early Osmond, MD  ?11/17/2021 8:29 AM    ?[place] ?[address], [place], [place]  [address] ?Phone: [phone]; Fax: [phone]  ? ?Note:  This document was prepared using Dragon voice recognition software and may include unintentional dictation errors. ?

## 2021-11-17 ENCOUNTER — Encounter: Payer: Self-pay | Admitting: Internal Medicine

## 2021-11-17 ENCOUNTER — Ambulatory Visit (INDEPENDENT_AMBULATORY_CARE_PROVIDER_SITE_OTHER): Payer: Medicare Other | Admitting: Internal Medicine

## 2021-11-17 VITALS — BP 118/68 | HR 67 | Ht 75.0 in | Wt 201.4 lb

## 2021-11-17 DIAGNOSIS — E785 Hyperlipidemia, unspecified: Secondary | ICD-10-CM

## 2021-11-17 DIAGNOSIS — I2510 Atherosclerotic heart disease of native coronary artery without angina pectoris: Secondary | ICD-10-CM

## 2021-11-17 NOTE — Patient Instructions (Signed)
Medication Instructions:  ?Your physician has recommended you make the following change in your medication:  ?1-STOP Imdur ? ?*If you need a refill on your cardiac medications before your next appointment, please call your pharmacy* ? ?Lab Work: ?Your physician recommends that you return for lab work in: 2 months for lipid and liver panel ? ?If you have labs (blood work) drawn today and your tests are completely normal, you will receive your results only by: ?MyChart Message (if you have MyChart) OR ?A paper copy in the mail ?If you have any lab test that is abnormal or we need to change your treatment, we will call you to review the results. ? ?Testing/Procedures: ?None ordered today. ? ?Follow-Up: ?At [facility], you and your health needs are our priority.  As part of our continuing mission to provide you with exceptional heart care, we have created designated Provider Care Teams.  These Care Teams include your primary Cardiologist (physician) and Advanced Practice Providers (APPs -  Physician Assistants and Nurse Practitioners) who all work together to provide you with the care you need, when you need it. ? ?We recommend signing up for the patient portal called "MyChart".  Sign up information is provided on this After Visit Summary.  MyChart is used to connect with patients for Virtual Visits (Telemedicine).  Patients are able to view lab/test results, encounter notes, upcoming appointments, etc.  Non-urgent messages can be sent to your provider as well.   ?To learn more about what you can do with MyChart, go to [email or web address].   ? ?Your next appointment:   ?1 year(s) ? ?The format for your next appointment:   ?In Person ? ?Provider:   ?Early Osmond, MD { ? ? ?Important Information About Sugar ? ? ? ? ?  ?

## 2021-11-26 ENCOUNTER — Telehealth: Payer: Self-pay | Admitting: Family Medicine

## 2021-11-26 NOTE — Telephone Encounter (Signed)
Left message for patient to call back and schedule Medicare Annual Wellness Visit (AWV) either virtually or in office. Left  my jabber number [phone] ? ? ?Last AWV 11/05/20 ?please schedule at anytime with [facility] Nurse Health Advisor 1 or 2 ? ? ? ?

## 2022-01-06 ENCOUNTER — Ambulatory Visit (INDEPENDENT_AMBULATORY_CARE_PROVIDER_SITE_OTHER): Payer: Medicare Other

## 2022-01-06 VITALS — Ht 75.0 in | Wt 200.0 lb

## 2022-01-06 DIAGNOSIS — Z0000 Encounter for general adult medical examination without abnormal findings: Secondary | ICD-10-CM

## 2022-01-06 NOTE — Patient Instructions (Signed)
Miguel Peters , Thank you for taking time to come for your Medicare Wellness Visit. I appreciate your ongoing commitment to your health goals. Please review the following plan we discussed and let me know if I can assist you in the future.   Screening recommendations/referrals: Colonoscopy: not required Recommended yearly ophthalmology/optometry visit for glaucoma screening and checkup Recommended yearly dental visit for hygiene and checkup  Vaccinations: Influenza vaccine: due 02/24/2022 Pneumococcal vaccine: completed 09/28/2019 Tdap vaccine: completed 06/22/2017, due 11/27/228 Shingles vaccine: completed   Covid-19:  05/11/2020, 12/06/2019, 09/08/2019, 08/18/2019  Advanced directives: Advance directive discussed with you today.   Conditions/risks identified: none  Next appointment: Follow up in one year for your annual wellness visit.   Preventive Care 4 Years and Older, Male Preventive care refers to lifestyle choices and visits with your health care provider that can promote health and wellness. What does preventive care include? A yearly physical exam. This is also called an annual well check. Dental exams once or twice a year. Routine eye exams. Ask your health care provider how often you should have your eyes checked. Personal lifestyle choices, including: Daily care of your teeth and gums. Regular physical activity. Eating a healthy diet. Avoiding tobacco and drug use. Limiting alcohol use. Practicing safe sex. Taking low doses of aspirin every day. Taking vitamin and mineral supplements as recommended by your health care provider. What happens during an annual well check? The services and screenings done by your health care provider during your annual well check will depend on your age, overall health, lifestyle risk factors, and family history of disease. Counseling  Your health care provider may ask you questions about your: Alcohol use. Tobacco use. Drug  use. Emotional well-being. Home and relationship well-being. Sexual activity. Eating habits. History of falls. Memory and ability to understand (cognition). Work and work Statistician. Screening  You may have the following tests or measurements: Height, weight, and BMI. Blood pressure. Lipid and cholesterol levels. These may be checked every 5 years, or more frequently if you are over 24 years old. Skin check. Lung cancer screening. You may have this screening every year starting at age 62 if you have a 30-pack-year history of smoking and currently smoke or have quit within the past 15 years. Fecal occult blood test (FOBT) of the stool. You may have this test every year starting at age 37. Flexible sigmoidoscopy or colonoscopy. You may have a sigmoidoscopy every 5 years or a colonoscopy every 10 years starting at age 68. Prostate cancer screening. Recommendations will vary depending on your family history and other risks. Hepatitis C blood test. Hepatitis B blood test. Sexually transmitted disease (STD) testing. Diabetes screening. This is done by checking your blood sugar (glucose) after you have not eaten for a while (fasting). You may have this done every 1-3 years. Abdominal aortic aneurysm (AAA) screening. You may need this if you are a current or former smoker. Osteoporosis. You may be screened starting at age 36 if you are at high risk. Talk with your health care provider about your test results, treatment options, and if necessary, the need for more tests. Vaccines  Your health care provider may recommend certain vaccines, such as: Influenza vaccine. This is recommended every year. Tetanus, diphtheria, and acellular pertussis (Tdap, Td) vaccine. You may need a Td booster every 10 years. Zoster vaccine. You may need this after age 71. Pneumococcal 13-valent conjugate (PCV13) vaccine. One dose is recommended after age 30. Pneumococcal polysaccharide (PPSV23) vaccine. One dose is  recommended after age 63. Talk to your health care provider about which screenings and vaccines you need and how often you need them. This information is not intended to replace advice given to you by your health care provider. Make sure you discuss any questions you have with your health care provider. Document Released: 08/09/2015 Document Revised: 04/01/2016 Document Reviewed: 05/14/2015 Elsevier Interactive Patient Education  2017 [place] Prevention in the Home Falls can cause injuries. They can happen to people of all ages. There are many things you can do to make your home safe and to help prevent falls. What can I do on the outside of my home? Regularly fix the edges of walkways and driveways and fix any cracks. Remove anything that might make you trip as you walk through a door, such as a raised step or threshold. Trim any bushes or trees on the path to your home. Use bright outdoor lighting. Clear any walking paths of anything that might make someone trip, such as rocks or tools. Regularly check to see if handrails are loose or broken. Make sure that both sides of any steps have handrails. Any raised decks and porches should have guardrails on the edges. Have any leaves, snow, or ice cleared regularly. Use sand or salt on walking paths during winter. Clean up any spills in your garage right away. This includes oil or grease spills. What can I do in the bathroom? Use night lights. Install grab bars by the toilet and in the tub and shower. Do not use towel bars as grab bars. Use non-skid mats or decals in the tub or shower. If you need to sit down in the shower, use a plastic, non-slip stool. Keep the floor dry. Clean up any water that spills on the floor as soon as it happens. Remove soap buildup in the tub or shower regularly. Attach bath mats securely with double-sided non-slip rug tape. Do not have throw rugs and other things on the floor that can make you  trip. What can I do in the bedroom? Use night lights. Make sure that you have a light by your bed that is easy to reach. Do not use any sheets or blankets that are too big for your bed. They should not hang down onto the floor. Have a firm chair that has side arms. You can use this for support while you get dressed. Do not have throw rugs and other things on the floor that can make you trip. What can I do in the kitchen? Clean up any spills right away. Avoid walking on wet floors. Keep items that you use a lot in easy-to-reach places. If you need to reach something above you, use a strong step stool that has a grab bar. Keep electrical cords out of the way. Do not use floor polish or wax that makes floors slippery. If you must use wax, use non-skid floor wax. Do not have throw rugs and other things on the floor that can make you trip. What can I do with my stairs? Do not leave any items on the stairs. Make sure that there are handrails on both sides of the stairs and use them. Fix handrails that are broken or loose. Make sure that handrails are as long as the stairways. Check any carpeting to make sure that it is firmly attached to the stairs. Fix any carpet that is loose or worn. Avoid having throw rugs at the top or bottom of the stairs. If you  do have throw rugs, attach them to the floor with carpet tape. Make sure that you have a light switch at the top of the stairs and the bottom of the stairs. If you do not have them, ask someone to add them for you. What else can I do to help prevent falls? Wear shoes that: Do not have high heels. Have rubber bottoms. Are comfortable and fit you well. Are closed at the toe. Do not wear sandals. If you use a stepladder: Make sure that it is fully opened. Do not climb a closed stepladder. Make sure that both sides of the stepladder are locked into place. Ask someone to hold it for you, if possible. Clearly mark and make sure that you can  see: Any grab bars or handrails. First and last steps. Where the edge of each step is. Use tools that help you move around (mobility aids) if they are needed. These include: Canes. Walkers. Scooters. Crutches. Turn on the lights when you go into a dark area. Replace any light bulbs as soon as they burn out. Set up your furniture so you have a clear path. Avoid moving your furniture around. If any of your floors are uneven, fix them. If there are any pets around you, be aware of where they are. Review your medicines with your doctor. Some medicines can make you feel dizzy. This can increase your chance of falling. Ask your doctor what other things that you can do to help prevent falls. This information is not intended to replace advice given to you by your health care provider. Make sure you discuss any questions you have with your health care provider. Document Released: 05/09/2009 Document Revised: 12/19/2015 Document Reviewed: 08/17/2014 Elsevier Interactive Patient Education  2017 [employer].

## 2022-01-06 NOTE — Progress Notes (Cosign Needed)
I connected with [facility] today by telephone and verified that I am speaking with the correct person using two identifiers. Location patient: home Location provider: work Persons participating in the virtual visit: Donne Hazel, Glenna Durand LPN.   I discussed the limitations, risks, security and privacy concerns of performing an evaluation and management service by telephone and the availability of in person appointments. I also discussed with the patient that there may be a patient responsible charge related to this service. The patient expressed understanding and verbally consented to this telephonic visit.    Interactive audio and video telecommunications were attempted between this provider and patient, however failed, due to patient having technical difficulties OR patient did not have access to video capability.  We continued and completed visit with audio only.     Vital signs may be patient reported or missing.  Subjective:   Miguel Peters is a 71 y.o. male who presents for Medicare Annual/Subsequent preventive examination.  Review of Systems     Cardiac Risk Factors include: advanced age (>86mn, >>22women);dyslipidemia;male gender     Objective:    Today's Vitals   01/06/22 0911  Weight: 200 lb (90.7 kg)  Height: '6\' 3"'$  (1.905 m)   Body mass index is 25 kg/m.     01/06/2022    9:18 AM 11/05/2020   11:53 AM 09/05/2018   12:38 PM 06/22/2017    9:20 AM 10/05/2014    8:04 AM 09/19/2014    9:28 AM  Advanced Directives  Does Patient Have a Medical Advance Directive? No No No No No No  Would patient like information on creating a medical advance directive?  No - Patient declined Yes (MAU/Ambulatory/Procedural Areas - Information given) Yes (MAU/Ambulatory/Procedural Areas - Information given)      Current Medications (verified) Outpatient Encounter Medications as of 01/06/2022  Medication Sig   aspirin 81 MG tablet Take 81 mg by mouth daily.    calcipotriene (DOVONOX) 0.005 % cream APP TOPICALLY TO GROIN AND BUTTOCKS BID   clindamycin (CLEOCIN T) 1 % external solution Apply topically 2 (two) times daily.   ibuprofen (ADVIL) 200 MG tablet Take 200 mg by mouth every 6 (six) hours as needed.   metoprolol succinate (TOPROL XL) 25 MG 24 hr tablet Take 1 tablet (25 mg total) by mouth at bedtime.   simvastatin (ZOCOR) 20 MG tablet TAKE 1 TABLET(20 MG) BY MOUTH AT BEDTIME   triamcinolone cream (KENALOG) 0.1 % Apply 1 application topically 2 (two) times daily as needed.   carbamide peroxide (DEBROX) 6.5 % OTIC solution Place 5 drops into the left ear 2 (two) times daily. Right ear. (Patient not taking: Reported on 01/06/2022)   Facility-Administered Encounter Medications as of 01/06/2022  Medication   0.9 %  sodium chloride infusion    Allergies (verified) Patient has no known allergies.   History: Past Medical History:  Diagnosis Date   Arthritis    Bell's palsy 2016   Right sided.  Not clear when acutely suffered this.  Was noted on exam after years of no medical attention 07/2014.  [facility]  [facility].   Hyperlipidemia    [place] age 71  cannot recall if something happened in life that set this off.   Weight loss 02/12/2017   Past Surgical History:  Procedure Laterality Date   COLONOSCOPY  10/05/2014   db 3 TAs   COLONOSCOPY  01/25/2020   hand surgery  thumb   POLYPECTOMY     TONSILLECTOMY     Family History  Problem Relation Age of Onset   Sickle cell anemia Mother    Kidney disease Father    Alcohol abuse Father    Colon cancer Neg Hx    Esophageal cancer Neg Hx    Stomach cancer Neg Hx    Rectal cancer Neg Hx    Colon polyps Neg Hx    Social History   Socioeconomic History   Marital status: Single    Spouse name: Not on file   Number of children: 0   Years of education: 12   Highest education level: 12th grade  Occupational History   Occupation:  Maintenance/Housekeeping    Employer: [employer]    Comment: 2016  Tobacco Use   Smoking status: Never   Smokeless tobacco: Never  Vaping Use   Vaping Use: Never used  Substance and Sexual Activity   Alcohol use: Yes    Alcohol/week: 0.0 standard drinks of alcohol    Comment: Once a monthly   Drug use: No   Sexual activity: Yes    Partners: Female  Other Topics Concern   Not on file  Social History Narrative   Was living at a boarding house.   Patient was having difficulty affording food--reportedly because he lived in boarding house and did not have utilitiy bills, his EBT dollars were cut to $13 to $15 per month.   That has been changed.  He now receives adequate EBT dollars.      Now living at [facility].  This is for folks with a disability.  He has an apartment now with a kitchen.  The [place] near him set him up in these apartments that they support.     Grant Ruts [phone]      09/05/18: Pt. Still living at [facility] [address], likes it. Sometimes participates in events hosted by center there.   Lives alone, but sister local, and main source of support.   Enjoys doing own workout routine with weights and walking. Enjoys reading.      Social Determinants of Health   Financial Resource Strain: Low Risk  (01/06/2022)   Overall Financial Resource Strain (CARDIA)    Difficulty of Paying Living Expenses: Not hard at all  Food Insecurity: No Food Insecurity (01/06/2022)   Hunger Vital Sign    Worried About Running Out of Food in the Last Year: Never true    Ran Out of Food in the Last Year: Never true  Transportation Needs: No Transportation Needs (01/06/2022)   PRAPARE - Hydrologist (Medical): No    Lack of Transportation (Non-Medical): No  Physical Activity: Sufficiently Active (01/06/2022)   Exercise Vital Sign    Days of Exercise per Week: 7 days    Minutes of Exercise per Session: 30 min  Stress: No Stress Concern Present  (01/06/2022)   [place]    Feeling of Stress : Not at all  Social Connections: Socially Isolated (11/05/2020)   Social Connection and Isolation Panel [NHANES]    Frequency of Communication with Friends and Family: Once a week    Frequency of Social Gatherings with Friends and Family: Once a week    Attends Religious Services: Never    Marine scientist or Organizations: No    Attends Archivist Meetings: Never    Marital Status: Never married  Tobacco Counseling Counseling given: Not Answered   Clinical Intake:  Pre-visit preparation completed: Yes  Pain : No/denies pain     Nutritional Status: BMI of 19-24  Normal Nutritional Risks: None Diabetes: No  How often do you need to have someone help you when you read instructions, pamphlets, or other written materials from your doctor or pharmacy?: 1 - Never What is the last grade level you completed in school?: 12th grade  Diabetic? no  Interpreter Needed?: No  Information entered by :: NAllen LPN   Activities of Daily Living    01/06/2022    9:19 AM  In your present state of health, do you have any difficulty performing the following activities:  Hearing? 0  Vision? 0  Difficulty concentrating or making decisions? 0  Walking or climbing stairs? 0  Dressing or bathing? 0  Doing errands, shopping? 0  Preparing Food and eating ? N  Using the Toilet? N  In the past six months, have you accidently leaked urine? N  Do you have problems with loss of bowel control? N  Managing your Medications? N  Managing your Finances? N  Housekeeping or managing your Housekeeping? N    Patient Care Team: [place], Betty G, MD as PCP - General (Family Medicine) Early Osmond, MD as PCP - Cardiology (Cardiology)  Indicate any recent Medical Services you may have received from other than [facility] providers in the past year (date may be approximate).      Assessment:   This is a routine wellness examination for [facility].  Hearing/Vision screen Vision Screening - Comments:: No regular eye exams,   Dietary issues and exercise activities discussed: Current Exercise Habits: Home exercise routine, Type of exercise: walking, Frequency (Times/Week): 7   Goals Addressed             This Visit's Progress    Patient Stated       01/06/2022, no goals       Depression Screen    01/06/2022    9:19 AM 07/23/2021   10:41 PM 11/05/2020   11:52 AM 09/13/2019    8:26 AM 09/05/2018   11:39 AM 08/30/2017    9:20 AM 08/14/2014   11:18 AM  PHQ 2/9 Scores  PHQ - 2 Score 0 2 0 0 0 0 0  PHQ- 9 Score  2   0      Fall Risk    01/06/2022    9:19 AM 07/23/2021   10:40 PM 11/05/2020   11:54 AM 10/04/2019    2:00 PM 09/05/2018   11:39 AM  Fall Risk   Falls in the past year? 0 0 0 0 0  Number falls in past yr: 0 0 0 0   Injury with Fall? 0 0 0 0   Risk for fall due to : Medication side effect      Follow up Falls evaluation completed;Education provided;Falls prevention discussed Education provided Falls prevention discussed Education provided     FALL RISK PREVENTION PERTAINING TO THE HOME:  Any stairs in or around the home? No  If so, are there any without handrails?  N/a Home free of loose throw rugs in walkways, pet beds, electrical cords, etc? Yes  Adequate lighting in your home to reduce risk of falls? Yes   ASSISTIVE DEVICES UTILIZED TO PREVENT FALLS:  Life alert? No  Use of a cane, walker or w/c? No  Grab bars in the bathroom? Yes  Shower chair or bench in shower? No  Elevated toilet seat or a handicapped toilet? Yes   TIMED UP AND GO:  Was the test performed? No .      Cognitive Function:        01/06/2022    9:20 AM 11/05/2020   11:57 AM  6CIT Screen  What Year? 0 points 0 points  What month? 0 points 0 points  What time? 0 points   Count back from 20 0 points 0 points  Months in reverse 2 points 0 points  Repeat  phrase 4 points 6 points  Total Score 6 points     Immunizations Immunization History  Administered Date(s) Administered   Fluad Quad(high Dose 65+) 04/28/2019, 06/14/2020   Influenza-Unspecified 06/26/2018, 05/01/2021   PFIZER(Purple Top)SARS-COV-2 Vaccination 08/18/2019, 09/08/2019, 12/06/2019, 05/11/2020   Pneumococcal Conjugate-13 09/05/2018   Pneumococcal Polysaccharide-23 09/28/2019   Tdap 06/22/2017   Zoster Recombinat (Shingrix) 02/08/2021, 05/01/2021    TDAP status: Up to date  Flu Vaccine status: Up to date  Pneumococcal vaccine status: Up to date  Covid-19 vaccine status: Completed vaccines  Qualifies for Shingles Vaccine? Yes   Zostavax completed Yes   Shingrix Completed?: Yes  Screening Tests Health Maintenance  Topic Date Due   COVID-19 Vaccine (5 - Pfizer series) 07/06/2020   INFLUENZA VACCINE  02/24/2022   TETANUS/TDAP  06/23/2027   Pneumonia Vaccine 31+ Years old  Completed   Hepatitis C Screening  Completed   Zoster Vaccines- Shingrix  Completed   HPV VACCINES  Aged Out   COLONOSCOPY (Pts 45-45yr Insurance coverage will need to be confirmed)  DHavanaMaintenance Due  Topic Date Due   COVID-19 Vaccine (5 - Pfizer series) 07/06/2020    Colorectal cancer screening: No longer required.   Lung Cancer Screening: (Low Dose CT Chest recommended if Age 69-80years, 30 pack-year currently smoking OR have quit w/in 15years.) does not qualify.   Lung Cancer Screening Referral: no  Additional Screening:  Hepatitis C Screening: does qualify; Completed 09/12/2018  Vision Screening: Recommended annual ophthalmology exams for early detection of glaucoma and other disorders of the eye. Is the patient up to date with their annual eye exam?  No  Who is the provider or what is the name of the office in which the patient attends annual eye exams? none If pt is not established with a provider, would they like to be referred to  a provider to establish care? No .   Dental Screening: Recommended annual dental exams for proper oral hygiene  Community Resource Referral / Chronic Care Management: CRR required this visit?  No   CCM required this visit?  No      Plan:     I have personally reviewed and noted the following in the patient's chart:   Medical and social history Use of alcohol, tobacco or illicit drugs  Current medications and supplements including opioid prescriptions. Patient is not currently taking opioid prescriptions. Functional ability and status Nutritional status Physical activity Advanced directives List of other physicians Hospitalizations, surgeries, and ER visits in previous 12 months Vitals Screenings to include cognitive, depression, and falls Referrals and appointments  In addition, I have reviewed and discussed with patient certain preventive protocols, quality metrics, and best practice recommendations. A written personalized care plan for preventive services as well as general preventive health recommendations were provided to patient.     NKellie Simmering LPN   [id number]  Nurse Notes: none  Due to this being a virtual  visit, the after visit summary with patients personalized plan was offered to patient via mail or my-chart. Patient preferred to pick up at office at next visit

## 2022-01-20 ENCOUNTER — Telehealth: Payer: Self-pay | Admitting: *Deleted

## 2022-01-20 ENCOUNTER — Other Ambulatory Visit: Payer: Medicare Other

## 2022-01-20 DIAGNOSIS — I2510 Atherosclerotic heart disease of native coronary artery without angina pectoris: Secondary | ICD-10-CM | POA: Diagnosis not present

## 2022-01-20 DIAGNOSIS — E785 Hyperlipidemia, unspecified: Secondary | ICD-10-CM | POA: Diagnosis not present

## 2022-01-20 LAB — HEPATIC FUNCTION PANEL
ALT: 18 IU/L (ref 0–44)
AST: 15 IU/L (ref 0–40)
Albumin: 4.4 g/dL (ref 3.8–4.8)
Alkaline Phosphatase: 87 IU/L (ref 44–121)
Bilirubin Total: 0.7 mg/dL (ref 0.0–1.2)
Bilirubin, Direct: 0.2 mg/dL (ref 0.00–0.40)
Total Protein: 6.7 g/dL (ref 6.0–8.5)

## 2022-01-20 LAB — LIPID PANEL
Chol/HDL Ratio: 3.2 ratio (ref 0.0–5.0)
Cholesterol, Total: 186 mg/dL (ref 100–199)
HDL: 59 mg/dL (ref >39)
LDL Chol Calc (NIH): 113 mg/dL — ABNORMAL HIGH (ref 0–99)
Triglycerides: 77 mg/dL (ref 0–149)
VLDL Cholesterol Cal: 14 mg/dL (ref 5–40)

## 2022-01-20 MED ORDER — ATORVASTATIN CALCIUM 40 MG PO TABS: 40.0000 mg | ORAL_TABLET | Freq: Every day | ORAL | 3 refills | Status: DC

## 2022-03-23 ENCOUNTER — Ambulatory Visit: Payer: Medicare Other | Attending: Internal Medicine

## 2022-03-23 DIAGNOSIS — E785 Hyperlipidemia, unspecified: Secondary | ICD-10-CM

## 2022-03-23 LAB — HEPATIC FUNCTION PANEL
ALT: 14 IU/L (ref 0–44)
AST: 14 IU/L (ref 0–40)
Albumin: 4.4 g/dL (ref 3.9–4.9)
Alkaline Phosphatase: 91 IU/L (ref 44–121)
Bilirubin Total: 0.8 mg/dL (ref 0.0–1.2)
Bilirubin, Direct: 0.21 mg/dL (ref 0.00–0.40)
Total Protein: 6.5 g/dL (ref 6.0–8.5)

## 2022-03-23 LAB — LIPID PANEL
Chol/HDL Ratio: 2.9 ratio (ref 0.0–5.0)
Cholesterol, Total: 161 mg/dL (ref 100–199)
HDL: 55 mg/dL (ref >39)
LDL Chol Calc (NIH): 93 mg/dL (ref 0–99)
Triglycerides: 68 mg/dL (ref 0–149)
VLDL Cholesterol Cal: 13 mg/dL (ref 5–40)

## 2022-03-25 ENCOUNTER — Telehealth: Payer: Self-pay | Admitting: Orthopedic Surgery

## 2022-03-25 NOTE — Telephone Encounter (Signed)
Patient called needing an appointment for his left knee. Patient said he is having trouble putting weight on the leg. The number to contact patient is [phone]

## 2022-03-25 NOTE — Telephone Encounter (Signed)
IC appt scheduled for Miguel Peters next Thursday- Patient unable to make work in appt for this afternoon.

## 2022-03-27 ENCOUNTER — Encounter: Payer: Medicare Other | Admitting: Family Medicine

## 2022-03-29 NOTE — Progress Notes (Deleted)
HPI:  [name] is a 71 y.o.male here today for his routine physical examination.  Last CPE: *** He lives with ***  Regular exercise 3 or more times per week: *** Following a healthy diet: ***  Chronic medical problems: HLD, right-sided Bell's palsy,aortic atherosclerosis,and chronic back pain among some.  Immunization History  Administered Date(s) Administered   Fluad Quad(high Dose 65+) 04/28/2019, 06/14/2020   Influenza-Unspecified 06/26/2018, 05/01/2021   PFIZER(Purple Top)SARS-COV-2 Vaccination 08/18/2019, 09/08/2019, 12/06/2019, 05/11/2020   Pneumococcal Conjugate-13 09/05/2018   Pneumococcal Polysaccharide-23 09/28/2019   Tdap 06/22/2017   Zoster Recombinat (Shingrix) 02/08/2021, 05/01/2021    Health Maintenance  Topic Date Due   COVID-19 Vaccine (5 - Pfizer series) 07/06/2020   INFLUENZA VACCINE  02/24/2022   TETANUS/TDAP  06/23/2027   Pneumonia Vaccine 9+ Years old  Completed   Hepatitis C Screening  Completed   Zoster Vaccines- Shingrix  Completed   HPV VACCINES  Aged Out   COLONOSCOPY (Pts 45-14yr Insurance coverage will need to be confirmed)  Discontinued    Last prostate ca screening: *** Lab Results  Component Value Date   PSA1 0.6 06/22/2017   PSA 0.96 09/13/2019   PSA 0.91 09/12/2018   PSA 1.53 08/14/2014   -Negative for high alcohol intake or tobacco use.  -Concerns and/or follow up today: *** CAD on Metoprolol succinate 25 mg daily.  HLD on Atorvastatin 40 mg daily.  Lab Results  Component Value Date   CHOL 161 03/23/2022   HDL 55 03/23/2022   LDLCALC 93 03/23/2022   TRIG 68 03/23/2022   CHOLHDL 2.9 03/23/2022    Review of Systems   Current Outpatient Medications on File Prior to Visit  Medication Sig Dispense Refill   aspirin 81 MG tablet Take 81 mg by mouth daily.     atorvastatin (LIPITOR) 40 MG tablet Take 1 tablet (40 mg total) by mouth daily. 90 tablet 3   calcipotriene (DOVONOX) 0.005 % cream APP TOPICALLY  TO GROIN AND BUTTOCKS BID     carbamide peroxide (DEBROX) 6.5 % OTIC solution Place 5 drops into the left ear 2 (two) times daily. Right ear. (Patient not taking: Reported on 01/06/2022) 15 mL 0   clindamycin (CLEOCIN T) 1 % external solution Apply topically 2 (two) times daily.     ibuprofen (ADVIL) 200 MG tablet Take 200 mg by mouth every 6 (six) hours as needed.     metoprolol succinate (TOPROL XL) 25 MG 24 hr tablet Take 1 tablet (25 mg total) by mouth at bedtime. 90 tablet 3   triamcinolone cream (KENALOG) 0.1 % Apply 1 application topically 2 (two) times daily as needed. 30 g 2   Current Facility-Administered Medications on File Prior to Visit  Medication Dose Route Frequency Provider Last Rate Last Admin   0.9 %  sodium chloride infusion  500 mL Intravenous Once DDoran Stabler MD         Past Medical History:  Diagnosis Date   Arthritis    Bell's palsy 2016   Right sided.  Not clear when acutely suffered this.  Was noted on exam after years of no medical attention 07/2014.  [facility]  [facility].   Hyperlipidemia    Sickle cell trait ([place]    Stutter age 71  cannot recall if something happened in life that set this off.   Weight loss 02/12/2017    Past Surgical History:  Procedure Laterality Date   COLONOSCOPY  10/05/2014  db 3 TAs   COLONOSCOPY  01/25/2020   hand surgery     thumb   POLYPECTOMY     TONSILLECTOMY      No Known Allergies  Family History  Problem Relation Age of Onset   Sickle cell anemia Mother    Kidney disease Father    Alcohol abuse Father    Colon cancer Neg Hx    Esophageal cancer Neg Hx    Stomach cancer Neg Hx    Rectal cancer Neg Hx    Colon polyps Neg Hx     Social History   Socioeconomic History   Marital status: Single    Spouse name: Not on file   Number of children: 0   Years of education: 12   Highest education level: 12th grade  Occupational History   Occupation: Maintenance/Housekeeping     Employer: [employer]    Comment: 2016  Tobacco Use   Smoking status: Never   Smokeless tobacco: Never  Vaping Use   Vaping Use: Never used  Substance and Sexual Activity   Alcohol use: Yes    Alcohol/week: 0.0 standard drinks of alcohol    Comment: Once a monthly   Drug use: No   Sexual activity: Yes    Partners: Female  Other Topics Concern   Not on file  Social History Narrative   Was living at a boarding house.   Patient was having difficulty affording food--reportedly because he lived in boarding house and did not have utilitiy bills, his EBT dollars were cut to $13 to $15 per month.   That has been changed.  He now receives adequate EBT dollars.      Now living at [facility].  This is for folks with a disability.  He has an apartment now with a kitchen.  The [place] near him set him up in these apartments that they support.     Grant Ruts [phone]      09/05/18: Pt. Still living at [facility], likes it. Sometimes participates in events hosted by center there.   Lives alone, but sister local, and main source of support.   Enjoys doing own workout routine with weights and walking. Enjoys reading.      Social Determinants of Health   Financial Resource Strain: Low Risk  (01/06/2022)   Overall Financial Resource Strain (CARDIA)    Difficulty of Paying Living Expenses: Not hard at all  Food Insecurity: No Food Insecurity (01/06/2022)   Hunger Vital Sign    Worried About Running Out of Food in the Last Year: Never true    Ran Out of Food in the Last Year: Never true  Transportation Needs: No Transportation Needs (01/06/2022)   PRAPARE - Hydrologist (Medical): No    Lack of Transportation (Non-Medical): No  Physical Activity: Sufficiently Active (01/06/2022)   Exercise Vital Sign    Days of Exercise per Week: 7 days    Minutes of Exercise per Session: 30 min  Stress: No Stress Concern Present (01/06/2022)   [place]    Feeling of Stress : Not at all  Social Connections: Socially Isolated (11/05/2020)   Social Connection and Isolation Panel [NHANES]    Frequency of Communication with Friends and Family: Once a week    Frequency of Social Gatherings with Friends and Family: Once a week    Attends Religious Services: Never    Retail buyer of  Clubs or Organizations: No    Attends Archivist Meetings: Never    Marital Status: Never married     There were no vitals filed for this visit. There is no height or weight on file to calculate BMI.   Wt Readings from Last 3 Encounters:  01/06/22 200 lb (90.7 kg)  11/17/21 201 lb 6.4 oz (91.4 kg)  09/03/21 205 lb (93 kg)    Physical Exam  ASSESSMENT AND PLAN:  There are no diagnoses linked to this encounter. No orders of the defined types were placed in this encounter.    No problem-specific Assessment & Plan notes found for this encounter.    No follow-ups on file.   Donna Snooks G. Martinique, MD  [facility]. [place] office.

## 2022-03-31 ENCOUNTER — Encounter: Payer: Medicare Other | Admitting: Family Medicine

## 2022-03-31 DIAGNOSIS — Z0000 Encounter for general adult medical examination without abnormal findings: Secondary | ICD-10-CM

## 2022-03-31 DIAGNOSIS — E785 Hyperlipidemia, unspecified: Secondary | ICD-10-CM

## 2022-03-31 DIAGNOSIS — I700 Atherosclerosis of aorta: Secondary | ICD-10-CM

## 2022-04-02 ENCOUNTER — Ambulatory Visit (INDEPENDENT_AMBULATORY_CARE_PROVIDER_SITE_OTHER): Payer: Medicare Other | Admitting: Surgical

## 2022-04-02 ENCOUNTER — Encounter: Payer: Self-pay | Admitting: Surgical

## 2022-04-02 DIAGNOSIS — M170 Bilateral primary osteoarthritis of knee: Secondary | ICD-10-CM | POA: Diagnosis not present

## 2022-04-02 DIAGNOSIS — M1712 Unilateral primary osteoarthritis, left knee: Secondary | ICD-10-CM | POA: Diagnosis not present

## 2022-04-02 MED ORDER — BUPIVACAINE HCL 0.25 % IJ SOLN
4.0000 mL | INTRAMUSCULAR | Status: AC | PRN
  Administered 2022-04-02: 4 mL via INTRA_ARTICULAR

## 2022-04-02 MED ORDER — METHYLPREDNISOLONE ACETATE 40 MG/ML IJ SUSP
40.0000 mg | INTRAMUSCULAR | Status: AC | PRN
  Administered 2022-04-02: 40 mg via INTRA_ARTICULAR

## 2022-04-02 MED ORDER — LIDOCAINE HCL 1 % IJ SOLN
5.0000 mL | INTRAMUSCULAR | Status: AC | PRN
  Administered 2022-04-02: 5 mL

## 2022-04-02 NOTE — Progress Notes (Signed)
Office Visit Note   Patient: Miguel Peters [facility]           Date of Birth: 1950-09-27           MRN: [id number] Visit Date: 04/02/2022 Requested by: Martinique, Betty G, MD [address] [place],  [place] [address] PCP: Martinique, Betty G, MD  Subjective: Chief Complaint  Patient presents with   Knee Pain    HPI: Miguel Peters is a 71 y.o. male who presents to the office complaining of left knee pain.  Patient has history of left knee osteoarthritis.  He has had increased pain in the last week without any history of injury.  States that he awoke with his knee hurting 1 night.  No fevers or chills.  No history of gout.  Is able to bear weight on his left leg though it is more painful.  Most of his pain is in the medial aspect of the left knee with some lateral sided pain as well.  No quintal symptoms.  No instability.  No groin pain..                ROS: All systems reviewed are negative as they relate to the chief complaint within the history of present illness.  Patient denies fevers or chills.  Assessment & Plan: Visit Diagnoses:  1. Primary osteoarthritis of both knees     Plan: Patient is a 71 year old male who presents for evaluation of left knee pain.  He has history of left knee osteoarthritis that has become more symptomatic without injury in the last 1.5 weeks.  He would like to try injection today.  About 12 cc of nonpurulent synovial fluid was aspirated from the left knee and cortisone injection administered.  He tolerated the procedure well.  Follow-up in 6 weeks for clinical recheck with Dr. Marlou Sa.  He is considering surgical intervention and we can discuss this further based on how he responds to this injection.  Follow-Up Instructions: No follow-ups on file.   Orders:  No orders of the defined types were placed in this encounter.  No orders of the defined types were placed in this encounter.     Procedures: Large Joint Inj: L knee on 04/02/2022 10:59  AM Indications: diagnostic evaluation, joint swelling and pain Details: 18 G 1.5 in needle, superolateral approach  Arthrogram: No  Medications: 5 mL lidocaine 1 %; 40 mg methylPREDNISolone acetate 40 MG/ML; 4 mL bupivacaine 0.25 % Aspirate: 12 mL Outcome: tolerated well, no immediate complications Procedure, treatment alternatives, risks and benefits explained, specific risks discussed. Consent was given by the patient. Immediately prior to procedure a time out was called to verify the correct patient, procedure, equipment, support staff and site/side marked as required. Patient was prepped and draped in the usual sterile fashion.       Clinical Data: No additional findings.  Objective: Vital Signs: There were no vitals taken for this visit.  Physical Exam:  Constitutional: Patient appears well-developed HEENT:  Head: Normocephalic Eyes:EOM are normal Neck: Normal range of motion Cardiovascular: Normal rate Pulmonary/chest: Effort normal Neurologic: Patient is alert Skin: Skin is warm Psychiatric: Patient has normal mood and affect  Ortho Exam: Ortho exam demonstrates left knee with small effusion.  Tenderness over the medial joint line moderately and mild lateral joint line tenderness.  Able to perform straight leg raise.  No cellulitis or skin changes noted.  No asymmetric warmth.  Patient does not appear in any distress.  No calf tenderness.  Negative Homans' sign.  No pain with hip range of motion.  Stable to anterior and posterior drawer sign.  Stable to varus and valgus stress at 0 and 30 degrees.  Range of motion from 0 degrees extension to 1 to 5 degrees of knee flexion.  Specialty Comments:  No specialty comments available.  Imaging: No results found.   PMFS History: Patient Active Problem List   Diagnosis Date Noted   Aortic atherosclerosis ([place]) 07/25/2021   Chronic pruritic rash in adult 12/28/2017   Hyperlipidemia 08/30/2017   Chronic lower back pain  08/30/2017   Weight loss 02/12/2017   Stutter 01/08/2017   Right-sided Bell's palsy 09/11/2014   Bell's palsy 07/27/2014   Past Medical History:  Diagnosis Date   Arthritis    Bell's palsy 2016   Right sided.  Not clear when acutely suffered this.  Was noted on exam after years of no medical attention 07/2014.  Evaluated by [facility]  subsequently.   Hyperlipidemia    Sickle cell trait ([place])    Stutter age 29   cannot recall if something happened in life that set this off.   Weight loss 02/12/2017    Family History  Problem Relation Age of Onset   Sickle cell anemia Mother    Kidney disease Father    Alcohol abuse Father    Colon cancer Neg Hx    Esophageal cancer Neg Hx    Stomach cancer Neg Hx    Rectal cancer Neg Hx    Colon polyps Neg Hx     Past Surgical History:  Procedure Laterality Date   COLONOSCOPY  10/05/2014   db 3 TAs   COLONOSCOPY  01/25/2020   hand surgery     thumb   POLYPECTOMY     TONSILLECTOMY     Social History   Occupational History   Occupation: Maintenance/Housekeeping    Employer: [employer]    Comment: 2016  Tobacco Use   Smoking status: Never   Smokeless tobacco: Never  Vaping Use   Vaping Use: Never used  Substance and Sexual Activity   Alcohol use: Yes    Alcohol/week: 0.0 standard drinks of alcohol    Comment: Once a monthly   Drug use: No   Sexual activity: Yes    Partners: Female

## 2022-04-06 NOTE — Progress Notes (Deleted)
HPI:  [name] is a 71 y.o.male here today for his routine physical examination.  Last CPE: *** He lives with ***  Regular exercise 3 or more times per week: *** Following a healthy diet: ***  Chronic medical problems: HLD, right-sided Bell's palsy,aortic atherosclerosis,and chronic back pain among some.  Immunization History  Administered Date(s) Administered   Fluad Quad(high Dose 65+) 04/28/2019, 06/14/2020   Influenza-Unspecified 06/26/2018, 05/01/2021   PFIZER(Purple Top)SARS-COV-2 Vaccination 08/18/2019, 09/08/2019, 12/06/2019, 05/11/2020   Pneumococcal Conjugate-13 09/05/2018   Pneumococcal Polysaccharide-23 09/28/2019   Tdap 06/22/2017   Zoster Recombinat (Shingrix) 02/08/2021, 05/01/2021    Health Maintenance  Topic Date Due   COVID-19 Vaccine (5 - Pfizer series) 07/06/2020   INFLUENZA VACCINE  02/24/2022   TETANUS/TDAP  06/23/2027   Pneumonia Vaccine 96+ Years old  Completed   Hepatitis C Screening  Completed   Zoster Vaccines- Shingrix  Completed   HPV VACCINES  Aged Out   COLONOSCOPY (Pts 45-54yr Insurance coverage will need to be confirmed)  Discontinued    Last prostate ca screening: *** Lab Results  Component Value Date   PSA1 0.6 06/22/2017   PSA 0.96 09/13/2019   PSA 0.91 09/12/2018   PSA 1.53 08/14/2014   -Negative for high alcohol intake or tobacco use.  -Concerns and/or follow up today: *** CAD on Metoprolol succinate 25 mg daily.  HLD on Atorvastatin 40 mg daily.  Lab Results  Component Value Date   CHOL 161 03/23/2022   HDL 55 03/23/2022   LDLCALC 93 03/23/2022   TRIG 68 03/23/2022   CHOLHDL 2.9 03/23/2022    Review of Systems   Current Outpatient Medications on File Prior to Visit  Medication Sig Dispense Refill   aspirin 81 MG tablet Take 81 mg by mouth daily.     atorvastatin (LIPITOR) 40 MG tablet Take 1 tablet (40 mg total) by mouth daily. 90 tablet 3   calcipotriene (DOVONOX) 0.005 % cream APP TOPICALLY  TO GROIN AND BUTTOCKS BID     carbamide peroxide (DEBROX) 6.5 % OTIC solution Place 5 drops into the left ear 2 (two) times daily. Right ear. (Patient not taking: Reported on 01/06/2022) 15 mL 0   clindamycin (CLEOCIN T) 1 % external solution Apply topically 2 (two) times daily.     ibuprofen (ADVIL) 200 MG tablet Take 200 mg by mouth every 6 (six) hours as needed.     metoprolol succinate (TOPROL XL) 25 MG 24 hr tablet Take 1 tablet (25 mg total) by mouth at bedtime. 90 tablet 3   triamcinolone cream (KENALOG) 0.1 % Apply 1 application topically 2 (two) times daily as needed. 30 g 2   Current Facility-Administered Medications on File Prior to Visit  Medication Dose Route Frequency Provider Last Rate Last Admin   0.9 %  sodium chloride infusion  500 mL Intravenous Once DDoran Stabler MD         Past Medical History:  Diagnosis Date   Arthritis    Bell's palsy 2016   Right sided.  Not clear when acutely suffered this.  Was noted on exam after years of no medical attention 07/2014.  [facility]  [facility].   Hyperlipidemia    Sickle cell trait ([place]    Stutter age 71  cannot recall if something happened in life that set this off.   Weight loss 02/12/2017    Past Surgical History:  Procedure Laterality Date   COLONOSCOPY  10/05/2014  db 3 TAs   COLONOSCOPY  01/25/2020   hand surgery     thumb   POLYPECTOMY     TONSILLECTOMY      No Known Allergies  Family History  Problem Relation Age of Onset   Sickle cell anemia Mother    Kidney disease Father    Alcohol abuse Father    Colon cancer Neg Hx    Esophageal cancer Neg Hx    Stomach cancer Neg Hx    Rectal cancer Neg Hx    Colon polyps Neg Hx     Social History   Socioeconomic History   Marital status: Single    Spouse name: Not on file   Number of children: 0   Years of education: 12   Highest education level: 12th grade  Occupational History   Occupation: Maintenance/Housekeeping     Employer: [employer]    Comment: 2016  Tobacco Use   Smoking status: Never   Smokeless tobacco: Never  Vaping Use   Vaping Use: Never used  Substance and Sexual Activity   Alcohol use: Yes    Alcohol/week: 0.0 standard drinks of alcohol    Comment: Once a monthly   Drug use: No   Sexual activity: Yes    Partners: Female  Other Topics Concern   Not on file  Social History Narrative   Was living at a boarding house.   Patient was having difficulty affording food--reportedly because he lived in boarding house and did not have utilitiy bills, his EBT dollars were cut to $13 to $15 per month.   That has been changed.  He now receives adequate EBT dollars.      Now living at [facility].  This is for folks with a disability.  He has an apartment now with a kitchen.  The [place] near him set him up in these apartments that they support.     Grant Ruts [phone]      09/05/18: Pt. Still living at [facility], likes it. Sometimes participates in events hosted by center there.   Lives alone, but sister local, and main source of support.   Enjoys doing own workout routine with weights and walking. Enjoys reading.      Social Determinants of Health   Financial Resource Strain: Low Risk  (01/06/2022)   Overall Financial Resource Strain (CARDIA)    Difficulty of Paying Living Expenses: Not hard at all  Food Insecurity: No Food Insecurity (01/06/2022)   Hunger Vital Sign    Worried About Running Out of Food in the Last Year: Never true    Ran Out of Food in the Last Year: Never true  Transportation Needs: No Transportation Needs (01/06/2022)   PRAPARE - Hydrologist (Medical): No    Lack of Transportation (Non-Medical): No  Physical Activity: Sufficiently Active (01/06/2022)   Exercise Vital Sign    Days of Exercise per Week: 7 days    Minutes of Exercise per Session: 30 min  Stress: No Stress Concern Present (01/06/2022)   [place]    Feeling of Stress : Not at all  Social Connections: Socially Isolated (11/05/2020)   Social Connection and Isolation Panel [NHANES]    Frequency of Communication with Friends and Family: Once a week    Frequency of Social Gatherings with Friends and Family: Once a week    Attends Religious Services: Never    Retail buyer of  Clubs or Organizations: No    Attends Archivist Meetings: Never    Marital Status: Never married     There were no vitals filed for this visit. There is no height or weight on file to calculate BMI.   Wt Readings from Last 3 Encounters:  01/06/22 200 lb (90.7 kg)  11/17/21 201 lb 6.4 oz (91.4 kg)  09/03/21 205 lb (93 kg)    Physical Exam  ASSESSMENT AND PLAN:  There are no diagnoses linked to this encounter. No orders of the defined types were placed in this encounter.    No problem-specific Assessment & Plan notes found for this encounter.    No follow-ups on file.   Remington Highbaugh G. Martinique, MD  [facility]. [place] office.

## 2022-04-10 ENCOUNTER — Encounter: Payer: Medicare Other | Admitting: Family Medicine

## 2022-04-10 DIAGNOSIS — Z0000 Encounter for general adult medical examination without abnormal findings: Secondary | ICD-10-CM

## 2022-04-10 DIAGNOSIS — E785 Hyperlipidemia, unspecified: Secondary | ICD-10-CM

## 2022-04-10 DIAGNOSIS — I700 Atherosclerosis of aorta: Secondary | ICD-10-CM

## 2022-04-10 NOTE — Progress Notes (Unsigned)
HPI: Mr. FURQAN GOSSELIN is a 71 y.o.male here today for his routine physical examination.  Last CPE: 03/26/22.  He is trying to exercise regularly, walking a mile daily. He eats daily vegetables, loves sweets. Cooks his meals most of the time.  Chronic medical problems: HLD, right-sided Bell's palsy,aortic atherosclerosis,and chronic back pain among some.  Immunization History  Administered Date(s) Administered   Fluad Quad(high Dose 65+) 04/28/2019, 06/14/2020, 04/13/2022   Influenza-Unspecified 06/26/2018, 05/01/2021   PFIZER(Purple Top)SARS-COV-2 Vaccination 08/18/2019, 09/08/2019, 12/06/2019, 05/11/2020   Pneumococcal Conjugate-13 09/05/2018   Pneumococcal Polysaccharide-23 09/28/2019   Tdap 06/22/2017   Zoster Recombinat (Shingrix) 02/08/2021, 05/01/2021   Colonoscopy on 01/25/2020:  - Redundant colon. - The examination was otherwise normal on direct and retroflexion views. - No specimens collected. - Based on current guidelines, no repeat screening colonoscopy recommended.  Health Maintenance  Topic Date Due   COVID-19 Vaccine (5 - Pfizer series) 04/29/2022 (Originally 07/06/2020)   TETANUS/TDAP  06/23/2027   Pneumonia Vaccine 55+ Years old  Completed   INFLUENZA VACCINE  Completed   Hepatitis C Screening  Completed   Zoster Vaccines- Shingrix  Completed   HPV VACCINES  Aged Out   COLONOSCOPY (Pts 45-42yr Insurance coverage will need to be confirmed)  Discontinued   Last prostate ca screening:  Lab Results  Component Value Date   PSA1 0.6 06/22/2017   PSA 0.96 09/13/2019   PSA 0.91 09/12/2018   PSA 1.53 08/14/2014   -Negative for  tobacco use. He drinks a few beers per week.  Since his last visit he has seen orthopedist for knee pain, according to pt, he had aspiration of fluid and intraarticular injection. Pain has slightly improved.Possible gout, he is wearing a knee brace to help with stability.  He has also seen cardiologist for CAD and HLD. CAD on  Metoprolol succinate 25 mg daily.  HLD on Atorvastatin 40 mg daily. Tolerating medication well.  Lab Results  Component Value Date   CHOL 161 03/23/2022   HDL 55 03/23/2022   LDLCALC 93 03/23/2022   TRIG 68 03/23/2022   CHOLHDL 2.9 03/23/2022   Review of Systems  Constitutional:  Negative for activity change, appetite change and fever.  HENT:  Negative for mouth sores, nosebleeds, sore throat and trouble swallowing.   Eyes:  Negative for redness and visual disturbance.  Respiratory:  Negative for cough, shortness of breath and wheezing.   Cardiovascular:  Negative for chest pain, palpitations and leg swelling.  Gastrointestinal:  Negative for abdominal pain, blood in stool, nausea and vomiting.  Endocrine: Negative for cold intolerance, heat intolerance, polydipsia, polyphagia and polyuria.  Genitourinary:  Negative for decreased urine volume, dysuria, genital sores, hematuria and testicular pain.  Musculoskeletal:  Positive for arthralgias. Negative for myalgias.  Skin:  Negative for color change and rash.  Allergic/Immunologic: Positive for environmental allergies.  Neurological:  Negative for syncope, weakness and headaches.  Hematological:  Negative for adenopathy. Does not bruise/bleed easily.  Psychiatric/Behavioral:  Negative for confusion. The patient is not nervous/anxious.   All other systems reviewed and are negative.  Current Outpatient Medications on File Prior to Visit  Medication Sig Dispense Refill   aspirin 81 MG tablet Take 81 mg by mouth daily.     atorvastatin (LIPITOR) 40 MG tablet Take 1 tablet (40 mg total) by mouth daily. 90 tablet 3   calcipotriene (DOVONOX) 0.005 % cream APP TOPICALLY TO GROIN AND BUTTOCKS BID     ibuprofen (ADVIL) 200 MG tablet Take 200 mg  by mouth every 6 (six) hours as needed.     metoprolol succinate (TOPROL XL) 25 MG 24 hr tablet Take 1 tablet (25 mg total) by mouth at bedtime. 90 tablet 3   No current facility-administered  medications on file prior to visit.   Past Medical History:  Diagnosis Date   Arthritis    Bell's palsy 2016   Right sided.  Not clear when acutely suffered this.  Was noted on exam after years of no medical attention 07/2014.  Evaluated by [facility]  subsequently.   Hyperlipidemia    Sickle cell trait ([place])    Stutter age 10   cannot recall if something happened in life that set this off.   Weight loss 02/12/2017   Past Surgical History:  Procedure Laterality Date   COLONOSCOPY  10/05/2014   db 3 TAs   COLONOSCOPY  01/25/2020   hand surgery     thumb   POLYPECTOMY     TONSILLECTOMY     No Known Allergies  Family History  Problem Relation Age of Onset   Sickle cell anemia Mother    Kidney disease Father    Alcohol abuse Father    Colon cancer Neg Hx    Esophageal cancer Neg Hx    Stomach cancer Neg Hx    Rectal cancer Neg Hx    Colon polyps Neg Hx    Social History   Socioeconomic History   Marital status: Single    Spouse name: Not on file   Number of children: 0   Years of education: 12   Highest education level: 12th grade  Occupational History   Occupation: Maintenance/Housekeeping    Employer: [employer]    Comment: 2016  Tobacco Use   Smoking status: Never   Smokeless tobacco: Never  Vaping Use   Vaping Use: Never used  Substance and Sexual Activity   Alcohol use: Yes    Alcohol/week: 0.0 standard drinks of alcohol    Comment: Once a monthly   Drug use: No   Sexual activity: Yes    Partners: Female  Other Topics Concern   Not on file  Social History Narrative   Was living at a boarding house.   Patient was having difficulty affording food--reportedly because he lived in boarding house and did not have utilitiy bills, his EBT dollars were cut to $13 to $15 per month.   That has been changed.  He now receives adequate EBT dollars.      Now living at [facility].  This is for folks with a disability.  He has an apartment  now with a kitchen.  The [place] near him set him up in these apartments that they support.     Grant Ruts [phone]      09/05/18: Pt. Still living at [facility] [address], likes it. Sometimes participates in events hosted by center there.   Lives alone, but sister local, and main source of support.   Enjoys doing own workout routine with weights and walking. Enjoys reading.      Social Determinants of Health   Financial Resource Strain: Low Risk  (01/06/2022)   Overall Financial Resource Strain (CARDIA)    Difficulty of Paying Living Expenses: Not hard at all  Food Insecurity: No Food Insecurity (01/06/2022)   Hunger Vital Sign    Worried About Running Out of Food in the Last Year: Never true    Ran Out of Food in the Last Year: Never true  Transportation Needs: No Transportation Needs (01/06/2022)   PRAPARE - Hydrologist (Medical): No    Lack of Transportation (Non-Medical): No  Physical Activity: Sufficiently Active (01/06/2022)   Exercise Vital Sign    Days of Exercise per Week: 7 days    Minutes of Exercise per Session: 30 min  Stress: No Stress Concern Present (01/06/2022)   [place]    Feeling of Stress : Not at all  Social Connections: Socially Isolated (11/05/2020)   Social Connection and Isolation Panel [NHANES]    Frequency of Communication with Friends and Family: Once a week    Frequency of Social Gatherings with Friends and Family: Once a week    Attends Religious Services: Never    Marine scientist or Organizations: No    Attends Archivist Meetings: Never    Marital Status: Never married   Vitals:   04/13/22 1452  BP: 120/72  Pulse: 81  Resp: 16  SpO2: 98%  Body mass index is 24.05 kg/m. Wt Readings from Last 3 Encounters:  04/13/22 192 lb 6 oz (87.3 kg)  01/06/22 200 lb (90.7 kg)  11/17/21 201 lb 6.4 oz (91.4 kg)  Physical Exam Vitals and  nursing note reviewed.  Constitutional:      General: He is not in acute distress.    Appearance: He is well-developed.  HENT:     Head: Normocephalic and atraumatic.     Right Ear: External ear normal.     Left Ear: External ear normal.     Ears:     Comments: Bilateral cerumen excess, can not see TM.    Mouth/Throat:     Mouth: Mucous membranes are moist.     Pharynx: Oropharynx is clear.  Eyes:     Conjunctiva/sclera: Conjunctivae normal.     Pupils: Pupils are equal, round, and reactive to light.  Neck:     Thyroid: No thyroid mass.  Cardiovascular:     Rate and Rhythm: Normal rate and regular rhythm.     Pulses:          Dorsalis pedis pulses are 2+ on the right side and 2+ on the left side.     Heart sounds: No murmur heard. Pulmonary:     Effort: Pulmonary effort is normal. No respiratory distress.     Breath sounds: Normal breath sounds.  Abdominal:     Palpations: Abdomen is soft. There is no hepatomegaly or mass.     Tenderness: There is no abdominal tenderness.  Genitourinary:    Comments: No concerns. Musculoskeletal:        General: No tenderness.     Cervical back: Normal range of motion.     Comments: No signs of synovitis.  Lymphadenopathy:     Cervical: No cervical adenopathy.  Skin:    General: Skin is warm.     Findings: No erythema.  Neurological:     General: No focal deficit present.     Mental Status: He is alert and oriented to person, place, and time.     Sensory: No sensory deficit.     Deep Tendon Reflexes:     Reflex Scores:      Bicep reflexes are 2+ on the right side and 2+ on the left side.      Patellar reflexes are 2+ on the right side and 2+ on the left side.    Comments: Slight right-sided facial palsy.Rest  CN 2-12 grossly intact. Mildly unstable gait, it is not assisted.  Psychiatric:        Mood and Affect: Mood and affect normal.   ASSESSMENT AND PLAN:  Ms.Rafael was seen today for annual exam.  Diagnoses and all orders for  this visit: Orders Placed This Encounter  Procedures   Flu Vaccine QUAD High Dose(Fluad)   Routine general medical examination at a health care facility We discussed the importance of regular physical activity and healthy diet for prevention of chronic illness and/or complications. Preventive guidelines reviewed. Vaccination up to date. Colonoscopy in 01/2020 negative, no further screening was recommended. Next CPE in a year.  Hyperlipidemia, unspecified hyperlipidemia type LDL goal < 70. Continue Atorvastatin 40 mg daily and low fat diet.  Aortic atherosclerosis (HCC) On Atorvastatin 40 mg daily and Aspirin 81 mg daily.  Need for influenza vaccination -     Flu Vaccine QUAD High Dose(Fluad)  Return in 1 year (on 04/14/2023) for CPE.  Zeppelin Beckstrand G. Martinique, MD  [facility]. [place] office.

## 2022-04-13 ENCOUNTER — Ambulatory Visit (INDEPENDENT_AMBULATORY_CARE_PROVIDER_SITE_OTHER): Payer: Medicare Other | Admitting: Family Medicine

## 2022-04-13 ENCOUNTER — Encounter: Payer: Self-pay | Admitting: Family Medicine

## 2022-04-13 VITALS — BP 120/72 | HR 81 | Resp 16 | Ht 75.0 in | Wt 192.4 lb

## 2022-04-13 DIAGNOSIS — Z0000 Encounter for general adult medical examination without abnormal findings: Secondary | ICD-10-CM | POA: Diagnosis not present

## 2022-04-13 DIAGNOSIS — E785 Hyperlipidemia, unspecified: Secondary | ICD-10-CM

## 2022-04-13 DIAGNOSIS — Z23 Encounter for immunization: Secondary | ICD-10-CM

## 2022-04-13 DIAGNOSIS — I700 Atherosclerosis of aorta: Secondary | ICD-10-CM | POA: Diagnosis not present

## 2022-04-13 NOTE — Patient Instructions (Addendum)
A few things to remember from today's visit:  Routine general medical examination at a health care facility  Hyperlipidemia, unspecified hyperlipidemia type  Aortic atherosclerosis ([place])  If you need refills for medications you take chronically, please call your pharmacy. Do not use My Chart to request refills or for acute issues that need immediate attention. If you send a my chart message, it may take a few days to be addressed, specially if I am not in the office.  Please be sure medication list is accurate. If a new problem present, please set up appointment sooner than planned today.  Preventive Care 32 Years and Older, Male Preventive care refers to lifestyle choices and visits with your health care provider that can promote health and wellness. Preventive care visits are also called wellness exams. What can I expect for my preventive care visit? Counseling During your preventive care visit, your health care provider may ask about your: Medical history, including: Past medical problems. Family medical history. History of falls. Current health, including: Emotional well-being. Home life and relationship well-being. Sexual activity. Memory and ability to understand (cognition). Lifestyle, including: Alcohol, nicotine or tobacco, and drug use. Access to firearms. Diet, exercise, and sleep habits. Work and work Statistician. Sunscreen use. Safety issues such as seatbelt and bike helmet use. Physical exam Your health care provider will check your: Height and weight. These may be used to calculate your BMI (body mass index). BMI is a measurement that tells if you are at a healthy weight. Waist circumference. This measures the distance around your waistline. This measurement also tells if you are at a healthy weight and may help predict your risk of certain diseases, such as type 2 diabetes and high blood pressure. Heart rate and blood pressure. Body temperature. Skin for  abnormal spots. What immunizations do I need?  Vaccines are usually given at various ages, according to a schedule. Your health care provider will recommend vaccines for you based on your age, medical history, and lifestyle or other factors, such as travel or where you work. What tests do I need? Screening Your health care provider may recommend screening tests for certain conditions. This may include: Lipid and cholesterol levels. Diabetes screening. This is done by checking your blood sugar (glucose) after you have not eaten for a while (fasting). Hepatitis C test. Hepatitis B test. HIV (human immunodeficiency virus) test. STI (sexually transmitted infection) testing, if you are at risk. Lung cancer screening. Colorectal cancer screening. Prostate cancer screening. Abdominal aortic aneurysm (AAA) screening. You may need this if you are a current or former smoker. Talk with your health care provider about your test results, treatment options, and if necessary, the need for more tests. Follow these instructions at home: Eating and drinking  Eat a diet that includes fresh fruits and vegetables, whole grains, lean protein, and low-fat dairy products. Limit your intake of foods with high amounts of sugar, saturated fats, and salt. Take vitamin and mineral supplements as recommended by your health care provider. Do not drink alcohol if your health care provider tells you not to drink. If you drink alcohol: Limit how much you have to 0-2 drinks a day. Know how much alcohol is in your drink. In the [place] one drink equals one 12 oz bottle of beer (355 mL), one 5 oz glass of wine (148 mL), or one 1 oz glass of hard liquor (44 mL). Lifestyle Brush your teeth every morning and night with fluoride toothpaste. Floss one time each day. Exercise  for at least 30 minutes 5 or more days each week. Do not use any products that contain nicotine or tobacco. These products include cigarettes, chewing  tobacco, and vaping devices, such as e-cigarettes. If you need help quitting, ask your health care provider. Do not use drugs. If you are sexually active, practice safe sex. Use a condom or other form of protection to prevent STIs. Take aspirin only as told by your health care provider. Make sure that you understand how much to take and what form to take. Work with your health care provider to find out whether it is safe and beneficial for you to take aspirin daily. Ask your health care provider if you need to take a cholesterol-lowering medicine (statin). Find healthy ways to manage stress, such as: Meditation, yoga, or listening to music. Journaling. Talking to a trusted person. Spending time with friends and family. Safety Always wear your seat belt while driving or riding in a vehicle. Do not drive: If you have been drinking alcohol. Do not ride with someone who has been drinking. When you are tired or distracted. While texting. If you have been using any mind-altering substances or drugs. Wear a helmet and other protective equipment during sports activities. If you have firearms in your house, make sure you follow all gun safety procedures. Minimize exposure to UV radiation to reduce your risk of skin cancer. What's next? Visit your health care provider once a year for an annual wellness visit. Ask your health care provider how often you should have your eyes and teeth checked. Stay up to date on all vaccines. This information is not intended to replace advice given to you by your health care provider. Make sure you discuss any questions you have with your health care provider. Document Revised: 01/08/2021 Document Reviewed: 01/08/2021 Elsevier Patient Education  [place].

## 2022-05-13 ENCOUNTER — Ambulatory Visit (INDEPENDENT_AMBULATORY_CARE_PROVIDER_SITE_OTHER): Payer: Medicare Other | Admitting: Surgical

## 2022-05-13 ENCOUNTER — Encounter: Payer: Self-pay | Admitting: Orthopedic Surgery

## 2022-05-13 DIAGNOSIS — M170 Bilateral primary osteoarthritis of knee: Secondary | ICD-10-CM | POA: Diagnosis not present

## 2022-05-13 NOTE — Progress Notes (Signed)
Follow-up Office Visit Note   Patient: Miguel Peters           Date of Birth: [birth year]           MRN: [id number] Visit Date: 05/13/2022 Requested by: Martinique, Betty G, MD [address] [place],  [place] [address] PCP: Martinique, Betty G, MD  Subjective: Chief Complaint  Patient presents with   Left Knee - Follow-up    HPI: Miguel Peters is a 71 y.o. male who returns to the office for follow-up visit.    Plan at last visit was: Patient is a 71 year old male who presents for evaluation of left knee pain.  He has history of left knee osteoarthritis that has become more symptomatic without injury in the last 1.5 weeks.  He would like to try injection today.  About 12 cc of nonpurulent synovial fluid was aspirated from the left knee and cortisone injection administered.  He tolerated the procedure well.  Follow-up in 6 weeks for clinical recheck with Dr. Marlou Sa.  He is considering surgical intervention and we can discuss this further based on how he responds to this injection.  Since then, patient notes at least 50% improvement from injection.  Very satisfied with relief from the injection and wants to hold off on knee replacement.              ROS: All systems reviewed are negative as they relate to the chief complaint within the history of present illness.  Patient denies fevers or chills.  Assessment & Plan: Visit Diagnoses:  1. Primary osteoarthritis of both knees     Plan: Miguel Peters is a 71 y.o. male who returns to the office for follow-up visit for left knee arthritis following cortisone injection with aspiration on 04/02/2022.Marland Kitchen  Plan from last visit was noted above in HPI.  They now return with 50% improvement following injection.  Not really bothering him enough to consider knee replacement at this time.  He would like to continue with serial injections.  He will follow-up in 2 months for clinical recheck with Dr. Marlou Sa and to repeat injection at that time.  This  will be about 3 to 4 months out from his initial injection.  Follow-Up Instructions: No follow-ups on file.   Orders:  No orders of the defined types were placed in this encounter.  No orders of the defined types were placed in this encounter.     Procedures: No procedures performed   Clinical Data: No additional findings.  Objective: Vital Signs: There were no vitals taken for this visit.  Physical Exam:  Constitutional: Patient appears well-developed HEENT:  Head: Normocephalic Eyes:EOM are normal Neck: Normal range of motion Cardiovascular: Normal rate Pulmonary/chest: Effort normal Neurologic: Patient is alert Skin: Skin is warm Psychiatric: Patient has normal mood and affect  Ortho Exam: Ortho exam demonstrates left knee with small effusion.  No significant tenderness over the medial or lateral joint lines.  Excellent straight leg raise strength without extensor lag.  Ambulates without significant antalgia.  No pain with hip range of motion.  No cellulitis or skin changes noted.  Specialty Comments:  No specialty comments available.  Imaging: No results found.   PMFS History: Patient Active Problem List   Diagnosis Date Noted   Aortic atherosclerosis ([place]) 07/25/2021   Chronic pruritic rash in adult 12/28/2017   Hyperlipidemia 08/30/2017   Chronic lower back pain 08/30/2017   Stutter 01/08/2017   Right-sided Bell's palsy 09/11/2014   Bell's  palsy 07/27/2014   Past Medical History:  Diagnosis Date   Arthritis    Bell's palsy 2016   Right sided.  Not clear when acutely suffered this.  Was noted on exam after years of no medical attention 07/2014.  Evaluated by [facility]  subsequently.   Hyperlipidemia    Sickle cell trait ([place])    Stutter age 23   cannot recall if something happened in life that set this off.   Weight loss 02/12/2017    Family History  Problem Relation Age of Onset   Sickle cell anemia Mother    Kidney disease Father     Alcohol abuse Father    Colon cancer Neg Hx    Esophageal cancer Neg Hx    Stomach cancer Neg Hx    Rectal cancer Neg Hx    Colon polyps Neg Hx     Past Surgical History:  Procedure Laterality Date   COLONOSCOPY  10/05/2014   db 3 TAs   COLONOSCOPY  01/25/2020   hand surgery     thumb   POLYPECTOMY     TONSILLECTOMY     Social History   Occupational History   Occupation: Maintenance/Housekeeping    Employer: [employer]    Comment: 2016  Tobacco Use   Smoking status: Never   Smokeless tobacco: Never  Vaping Use   Vaping Use: Never used  Substance and Sexual Activity   Alcohol use: Yes    Alcohol/week: 0.0 standard drinks of alcohol    Comment: Once a monthly   Drug use: No   Sexual activity: Yes    Partners: Female

## 2022-06-10 NOTE — Progress Notes (Signed)
ACUTE VISIT Chief Complaint  Patient presents with   Eye Problem    Watery, wanting to get it checked by specialist.   HPI: [name] is a 71 y.o. male with medical history significant for hyperlipidemia, aortic atherosclerosis, right-sided Bell's palsy, and OA here today complaining of of right watery eye. He reports that the issue has been on and off for a long period of time, over a year. He denies any associated eye pain, purulent drainage, or itching. He states that his vision is normal and has not tried any over-the-counter medications for the issue. He has not noticed any correlation between the watery eye and weather changes or foreign body sensation when blinking.  Bell's palsy affecting the right side. He denies any relation between his watery eye and occasional runny nose when exposed to cold weather, allergic rhinitis. He mentions that he has not seen an eye doctor in a long time. He still has right-sided facial weakness and does not close his right eye completely. He reports that light sometimes bothers him and denies experiencing any headaches.  Eye Problem  This is a chronic problem. The problem has been unchanged. The pain is at a severity of 0/10. The patient is experiencing no pain. There is No known exposure to pink eye. He Does not wear contacts. Associated symptoms include photophobia (Occasionally). Pertinent negatives include no blurred vision, eye discharge, double vision, eye redness, fever, foreign body sensation, nausea, recent URI or vomiting. He has tried nothing for the symptoms.   Since his last visit, 04/13/2022, he has seen his orthopedist for left knee pain.  Pain is not getting any better, he is trying to avoid total knee replacement but states that eventually he will need to consider it. Pain is exacerbated by walking and alleviated by rest. He has not noted erythema but has had some effusion, reporting having arthrocentesis, which has helped with  pain.  Review of Systems  Constitutional:  Negative for fever.  HENT:  Positive for postnasal drip and rhinorrhea. Negative for sore throat.   Eyes:  Positive for photophobia (Occasionally). Negative for blurred vision, double vision, discharge and redness.  Respiratory:  Negative for cough and wheezing.   Gastrointestinal:  Negative for nausea and vomiting.  Musculoskeletal:  Positive for arthralgias.  Skin:  Negative for rash.  See other pertinent positives and negatives in HPI.  Current Outpatient Medications on File Prior to Visit  Medication Sig Dispense Refill   aspirin 81 MG tablet Take 81 mg by mouth daily.     atorvastatin (LIPITOR) 40 MG tablet Take 1 tablet (40 mg total) by mouth daily. 90 tablet 3   calcipotriene (DOVONOX) 0.005 % cream APP TOPICALLY TO GROIN AND BUTTOCKS BID     ibuprofen (ADVIL) 200 MG tablet Take 200 mg by mouth every 6 (six) hours as needed.     metoprolol succinate (TOPROL XL) 25 MG 24 hr tablet Take 1 tablet (25 mg total) by mouth at bedtime. 90 tablet 3   No current facility-administered medications on file prior to visit.   Past Medical History:  Diagnosis Date   Arthritis    Bell's palsy 2016   Right sided.  Not clear when acutely suffered this.  Was noted on exam after years of no medical attention 07/2014.  Evaluated by [facility]  subsequently.   Hyperlipidemia    Sickle cell trait ([place])    Stutter age 6   cannot recall if something happened in life that  set this off.   Weight loss 02/12/2017   No Known Allergies  Social History   Socioeconomic History   Marital status: Single    Spouse name: Not on file   Number of children: 0   Years of education: 12   Highest education level: 12th grade  Occupational History   Occupation: Maintenance/Housekeeping    Employer: [employer]    Comment: 2016  Tobacco Use   Smoking status: Never   Smokeless tobacco: Never  Vaping Use   Vaping Use: Never used  Substance and  Sexual Activity   Alcohol use: Yes    Alcohol/week: 0.0 standard drinks of alcohol    Comment: Once a monthly   Drug use: No   Sexual activity: Yes    Partners: Female  Other Topics Concern   Not on file  Social History Narrative   Was living at a boarding house.   Patient was having difficulty affording food--reportedly because he lived in boarding house and did not have utilitiy bills, his EBT dollars were cut to $13 to $15 per month.   That has been changed.  He now receives adequate EBT dollars.      Now living at [facility].  This is for folks with a disability.  He has an apartment now with a kitchen.  The [place] near him set him up in these apartments that they support.     Grant Ruts [phone]      09/05/18: Pt. Still living at [facility], likes it. Sometimes participates in events hosted by center there.   Lives alone, but sister local, and main source of support.   Enjoys doing own workout routine with weights and walking. Enjoys reading.      Social Determinants of Health   Financial Resource Strain: Low Risk  (01/06/2022)   Overall Financial Resource Strain (CARDIA)    Difficulty of Paying Living Expenses: Not hard at all  Food Insecurity: No Food Insecurity (01/06/2022)   Hunger Vital Sign    Worried About Running Out of Food in the Last Year: Never true    Ran Out of Food in the Last Year: Never true  Transportation Needs: No Transportation Needs (01/06/2022)   PRAPARE - Hydrologist (Medical): No    Lack of Transportation (Non-Medical): No  Physical Activity: Sufficiently Active (01/06/2022)   Exercise Vital Sign    Days of Exercise per Week: 7 days    Minutes of Exercise per Session: 30 min  Stress: No Stress Concern Present (01/06/2022)   [place]    Feeling of Stress : Not at all  Social Connections: Socially Isolated (11/05/2020)   Social Connection  and Isolation Panel [NHANES]    Frequency of Communication with Friends and Family: Once a week    Frequency of Social Gatherings with Friends and Family: Once a week    Attends Religious Services: Never    Marine scientist or Organizations: No    Attends Archivist Meetings: Never    Marital Status: Never married   Vitals:   06/12/22 1105  BP: 102/70  Pulse: 60  Resp: 16  Temp: 98 F (36.7 C)  SpO2: 97%   Body mass index is 24.17 kg/m.  Physical Exam Vitals and nursing note reviewed.  Constitutional:      General: He is not in acute distress.    Appearance: He is well-developed.  HENT:  Head: Normocephalic and atraumatic.     Nose: No rhinorrhea.     Right Turbinates: Enlarged.     Left Turbinates: Enlarged.     Mouth/Throat:     Mouth: Mucous membranes are moist.  Eyes:     General:        Right eye: No foreign body, discharge or hordeolum.        Left eye: No foreign body, discharge or hordeolum.     Extraocular Movements: Extraocular movements intact.     Conjunctiva/sclera: Conjunctivae normal.     Comments: Right eye epiphora. Raised conjunctival lesion on nasal angle of right eye,? Pinguecula right eye vs pterygium. PERRL but sluggish and slightly cloudy, ? Cataract.  Neck:     Trachea: No tracheal deviation.  Pulmonary:     Effort: Pulmonary effort is normal. No respiratory distress.     Breath sounds: Normal breath sounds.  Lymphadenopathy:     Cervical: No cervical adenopathy.  Skin:    General: Skin is warm.     Findings: No erythema or rash.  Neurological:     General: No focal deficit present.     Mental Status: He is alert and oriented to person, place, and time.     Comments: Right-sided facial palsy.  No other focal deficit appreciated. Antalgic gait, not assisted.  Psychiatric:        Mood and Affect: Mood and affect normal.   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 He would like to see an ophthalmologist, referral placed.  Orders: -     Ambulatory referral to Ophthalmology  Right-sided Bell's palsy Assessment & Plan: With residual weakness. Instructed to use tape to keep right eye closed when going to bed and to use natural tears, specially at bedtime to prevent corneal ulcers.  Orders: -     Ambulatory referral to Ophthalmology -     artificial tears; Place into both eyes at bedtime.  Dispense: 5 g; Refill: 2  Primary osteoarthritis of both knees Assessment & Plan: L>R. Problem is gradually getting worse. Fall precautions to continue. Continue following with orthopedics, has an appointment on 07/15/2022.   Return if symptoms worsen or fail to improve, for keep next appointment.  Orilla Templeman G. Martinique, MD  [facility] [place] office.

## 2022-06-12 ENCOUNTER — Ambulatory Visit (INDEPENDENT_AMBULATORY_CARE_PROVIDER_SITE_OTHER): Payer: Medicare Other | Admitting: Family Medicine

## 2022-06-12 ENCOUNTER — Encounter: Payer: Self-pay | Admitting: Family Medicine

## 2022-06-12 VITALS — BP 102/70 | HR 60 | Temp 98.0°F | Resp 16 | Ht 75.0 in | Wt 193.4 lb

## 2022-06-12 DIAGNOSIS — H04201 Unspecified epiphora, right lacrimal gland: Secondary | ICD-10-CM | POA: Diagnosis not present

## 2022-06-12 DIAGNOSIS — M170 Bilateral primary osteoarthritis of knee: Secondary | ICD-10-CM | POA: Diagnosis not present

## 2022-06-12 DIAGNOSIS — G510 Bell's palsy: Secondary | ICD-10-CM | POA: Diagnosis not present

## 2022-06-12 MED ORDER — ARTIFICIAL TEARS OPHTHALMIC OINT: TOPICAL_OINTMENT | Freq: Every day | OPHTHALMIC | 2 refills | Status: AC

## 2022-06-12 NOTE — Patient Instructions (Addendum)
A few things to remember from today's visit:  H/O Bell's palsy - Plan: Ambulatory referral to Ophthalmology, artificial tears (LACRILUBE) OINT ophthalmic ointment  Right epiphora - Plan: Ambulatory referral to Ophthalmology  Apply tears at bedtime and use a tape to keep your eye close. Symptom could be caused by your history of bell's palsy. Appt with ophthalmologist will be arranged.  If you need refills for medications you take chronically, please call your pharmacy. Do not use My Chart to request refills or for acute issues that need immediate attention. If you send a my chart message, it may take a few days to be addressed, specially if I am not in the office.  Please be sure medication list is accurate. If a new problem present, please set up appointment sooner than planned today.

## 2022-06-12 NOTE — Assessment & Plan Note (Signed)
L>R. Problem is gradually getting worse. Fall precautions to continue. Continue following with orthopedics, has an appointment on 07/15/2022.

## 2022-06-12 NOTE — Assessment & Plan Note (Signed)
With residual weakness. Instructed to use tape to keep right eye closed when going to bed and to use natural tears, specially at bedtime to prevent corneal ulcers.

## 2022-06-12 NOTE — Assessment & Plan Note (Signed)
This is a chronic problem, which could be a sequela from Bell's palsy. He would like to see an ophthalmologist, referral placed.

## 2022-06-30 DIAGNOSIS — L821 Other seborrheic keratosis: Secondary | ICD-10-CM | POA: Diagnosis not present

## 2022-06-30 DIAGNOSIS — L820 Inflamed seborrheic keratosis: Secondary | ICD-10-CM | POA: Diagnosis not present

## 2022-07-15 ENCOUNTER — Ambulatory Visit (INDEPENDENT_AMBULATORY_CARE_PROVIDER_SITE_OTHER): Payer: Medicare Other | Admitting: Orthopedic Surgery

## 2022-07-15 DIAGNOSIS — M170 Bilateral primary osteoarthritis of knee: Secondary | ICD-10-CM | POA: Diagnosis not present

## 2022-07-16 ENCOUNTER — Encounter: Payer: Self-pay | Admitting: Orthopedic Surgery

## 2022-07-16 NOTE — Progress Notes (Signed)
Office Visit Note   Patient: Miguel Peters           Date of Birth: [birth year]           MRN: [id number] Visit Date: 07/15/2022 Requested by: Martinique, Betty G, MD [address] [place],  [place] [address] PCP: Martinique, Betty G, MD  Subjective: Chief Complaint  Patient presents with   Follow-up    HPI: Miguel Peters is a 71 y.o. male who presents to the office reporting bilateral knee pain.  Overall he is feeling much better.  Has minimal symptoms at this time.  Has left greater than right knee arthritis.  Describes 1 to 2 mile walking endurance.  No mechanical symptoms..                ROS: All systems reviewed are negative as they relate to the chief complaint within the history of present illness.  Patient denies fevers or chills.  Assessment & Plan: Visit Diagnoses:  1. Primary osteoarthritis of both knees     Plan: Impression is bilateral knee arthritis with clinically improved symptoms at this time.  No effusion in the right knee.  Plan is aspiration and injection as needed but based on clinical symptoms today we will hold off on any intervention for now.  Follow-up as needed.  Continue with nonweightbearing quad strengthening exercises.  Follow-Up Instructions: No follow-ups on file.   Orders:  No orders of the defined types were placed in this encounter.  No orders of the defined types were placed in this encounter.     Procedures: No procedures performed   Clinical Data: No additional findings.  Objective: Vital Signs: There were no vitals taken for this visit.  Physical Exam:  Constitutional: Patient appears well-developed HEENT:  Head: Normocephalic Eyes:EOM are normal Neck: Normal range of motion Cardiovascular: Normal rate Pulmonary/chest: Effort normal Neurologic: Patient is alert Skin: Skin is warm Psychiatric: Patient has normal mood and affect  Ortho Exam: Ortho exam demonstrates range of motion on the left of 10-1 10 range of  motion on the right 0-1 20.  No effusion in either knee.  Extensor mechanism is intact.  No groin pain with internal/external Tatian of the leg.  Specialty Comments:  No specialty comments available.  Imaging: No results found.   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  Not clear when acutely suffered this.  Was noted on exam after years of no medical attention 07/2014.  Evaluated by [facility]  subsequently.   Hyperlipidemia    Sickle cell trait ([place])    Stutter age 50   cannot recall if something happened in life that set this off.   Weight loss 02/12/2017    Family History  Problem Relation Age of Onset   Sickle cell anemia Mother    Kidney disease Father    Alcohol abuse Father    Colon cancer Neg Hx    Esophageal cancer Neg Hx    Stomach cancer Neg Hx    Rectal cancer Neg Hx  Colon polyps Neg Hx     Past Surgical History:  Procedure Laterality Date   COLONOSCOPY  10/05/2014   db 3 TAs   COLONOSCOPY  01/25/2020   hand surgery     thumb   POLYPECTOMY     TONSILLECTOMY     Social History   Occupational History   Occupation: Maintenance/Housekeeping    Employer: [employer]    Comment: 2016  Tobacco Use   Smoking status: Never   Smokeless tobacco: Never  Vaping Use   Vaping Use: Never used  Substance and Sexual Activity   Alcohol use: Yes    Alcohol/week: 0.0 standard drinks of alcohol    Comment: Once a monthly   Drug use: No   Sexual activity: Yes    Partners: Female

## 2022-08-11 ENCOUNTER — Other Ambulatory Visit: Payer: Self-pay | Admitting: Internal Medicine

## 2022-08-18 DIAGNOSIS — L821 Other seborrheic keratosis: Secondary | ICD-10-CM | POA: Diagnosis not present

## 2022-08-18 DIAGNOSIS — L408 Other psoriasis: Secondary | ICD-10-CM | POA: Diagnosis not present

## 2022-08-24 DIAGNOSIS — H02532 Eyelid retraction right lower eyelid: Secondary | ICD-10-CM | POA: Diagnosis not present

## 2022-08-24 DIAGNOSIS — H02831 Dermatochalasis of right upper eyelid: Secondary | ICD-10-CM | POA: Diagnosis not present

## 2022-08-24 DIAGNOSIS — H02231 Paralytic lagophthalmos right upper eyelid: Secondary | ICD-10-CM | POA: Diagnosis not present

## 2022-08-24 DIAGNOSIS — H02834 Dermatochalasis of left upper eyelid: Secondary | ICD-10-CM | POA: Diagnosis not present

## 2022-08-24 DIAGNOSIS — G510 Bell's palsy: Secondary | ICD-10-CM | POA: Diagnosis not present

## 2022-08-24 DIAGNOSIS — H02535 Eyelid retraction left lower eyelid: Secondary | ICD-10-CM | POA: Diagnosis not present

## 2022-08-24 DIAGNOSIS — H25813 Combined forms of age-related cataract, bilateral: Secondary | ICD-10-CM | POA: Diagnosis not present

## 2022-10-05 IMAGING — CT CT HEART MORP W/ CTA COR W/ SCORE W/ CA W/CM &/OR W/O CM
4 of 8 series · 8 of 20 positions shown, 9 images · IV contrast (APPLIED)
Comparison: Chest radiograph of 07/23/2021
COMPARISON: Chest radiograph of 07/23/2021

Addendum:
EXAM:
OVER-READ INTERPRETATION  CT CHEST

The following report is an over-read performed by radiologist Dr.
Jords Barek [[email or web address]] on 08/18/2021. This over-read
does not include interpretation of cardiac or coronary anatomy or
pathology. The coronary CTA interpretation by the cardiologist is
attached.
CLINICAL DATA: Chest pain
Cardiac/Coronary CTA
TECHNIQUE: A non-contrast, gated CT scan was obtained with axial slices of 3 mm
through the heart for calcium scoring. Calcium scoring was performed
using the Agatston method. A 120 kV prospective, gated, contrast
cardiac scan was obtained. Gantry rotation speed was 250 msecs and
collimation was 0.6 mm. Two sublingual nitroglycerin tablets (0.8
mg) were given. The 3D data set was reconstructed in 5% intervals of
the 35-75% of the R-R cycle. Diastolic phases were analyzed on a
dedicated workstation using MPR, MIP, and VRT modes. The patient
received 95 cc of contrast.

[Series 6: best diast · axial · 0.39mm/px · z∈[+1242,+1284]mm · 2 of 316 slices shown, 3 images]
[im 106/316  vessel]
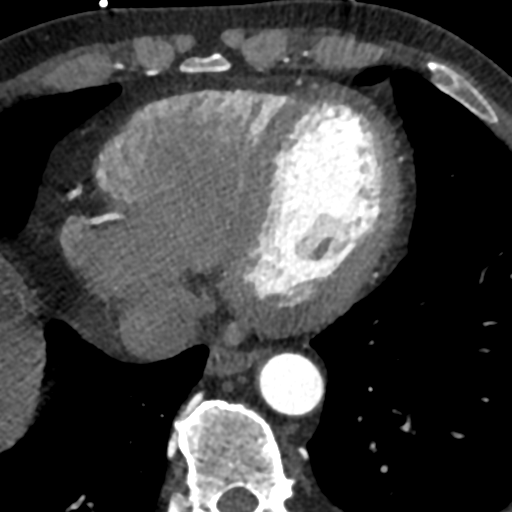
[im 106/316  lung]
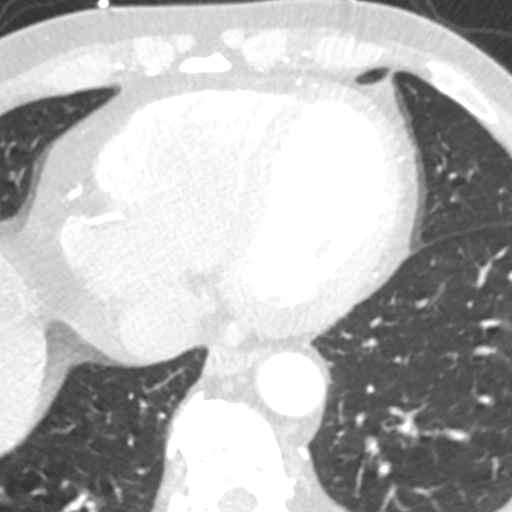
[im 211/316  vessel]
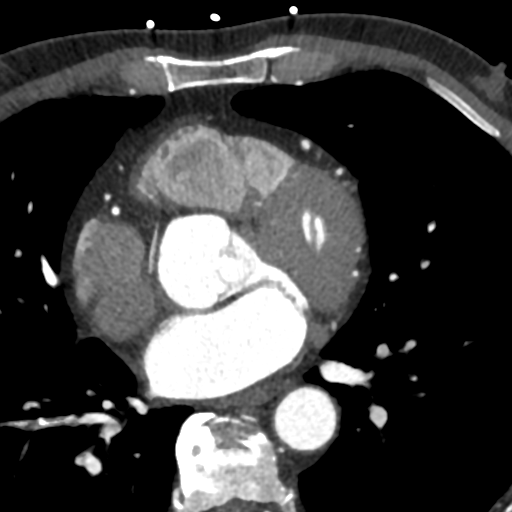

[Series 7: best syst · axial · 0.39mm/px · z∈[+1242,+1284]mm · 2 of 316 slices shown]
[im 106/316  vessel]
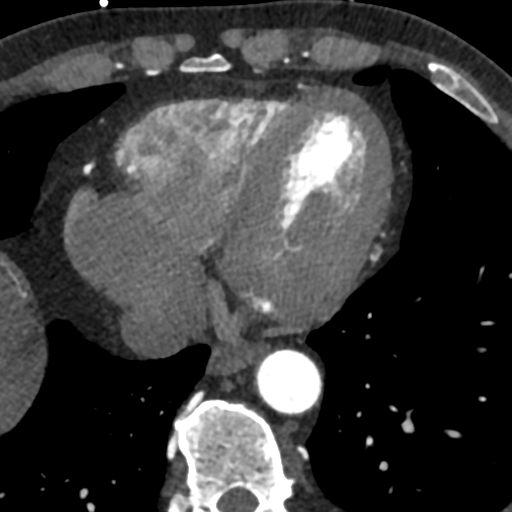
[im 211/316  vessel]
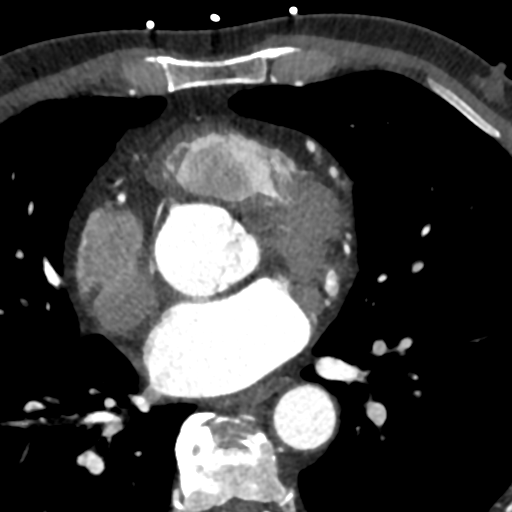

[Series 8: ts diast sharp · axial · 0.39mm/px · z∈[+1242,+1284]mm · 2 of 316 slices shown]
[im 106/316  lung]
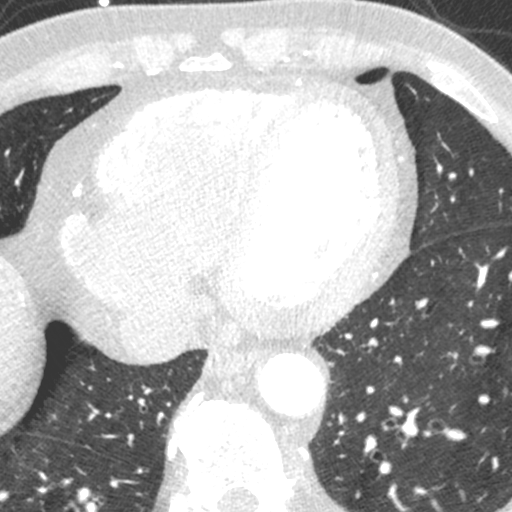
[im 211/316  lung]
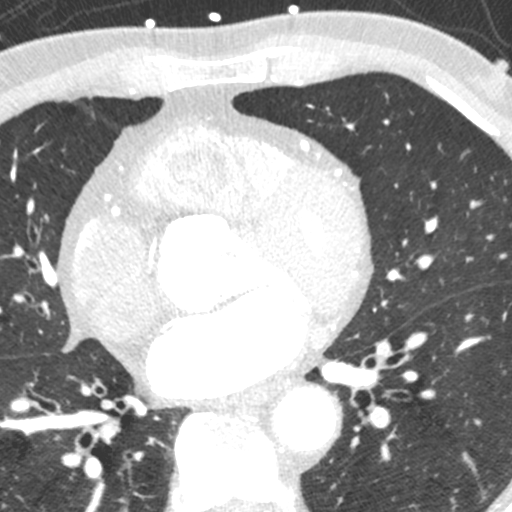

[Series 9: ts syst sharp · axial · 0.39mm/px · z∈[+1242,+1284]mm · 2 of 316 slices shown]
[im 106/316  lung]
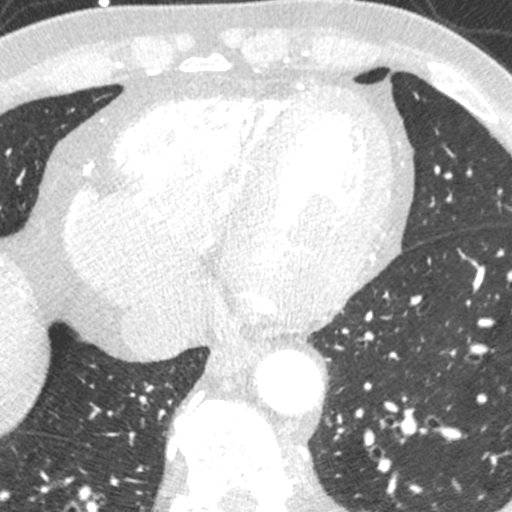
[im 211/316  lung]
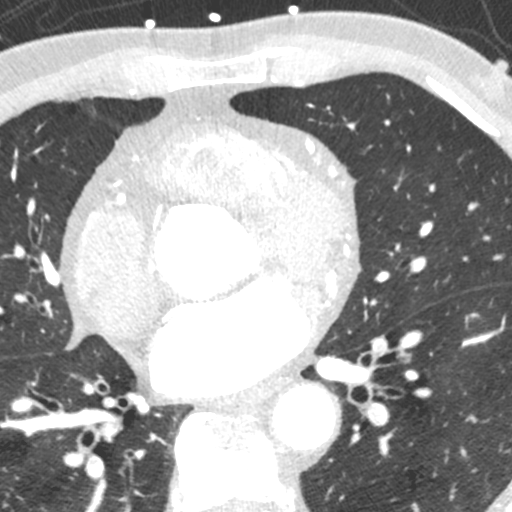

[8 of 20 positions shown; findings below may reference images not displayed]

FINDINGS: Vascular: Aortic atherosclerosis. No central pulmonary embolism, on
this non-dedicated study.

Mediastinum/Nodes: No imaged thoracic adenopathy.

Lungs/Pleura: No pleural fluid.  Clear imaged lungs.

Upper Abdomen: Normal imaged portions of the liver, spleen, stomach.

Musculoskeletal: No acute osseous abnormality.
IMPRESSION: No acute findings in the imaged extracardiac chest.

Aortic Atherosclerosis (SIUBC-FCX.X).
FINDINGS: Image quality: Excellent.

Noise artifact is: Limited.

Coronary Arteries:  Normal coronary origin.  Right dominance.

Left main: The left main is a large caliber vessel with a normal
take off from the left coronary cusp that trifurcates into a LAD,
LCX, and ramus intermedius. There is no plaque or stenosis.

Left anterior descending artery: The proximal LAD contains a
moderate mixed density plaque (50-69%). The mid LAD contains mild
mixed density plaque (25-49%). The distal LAD contains minimal
calcified plaque (<25%). The LAD gives off 1 patent diagonal branch.

Ramus intermedius: Patent with no evidence of plaque or stenosis.

Left circumflex artery: The LCX is non-dominant and patent with no
evidence of plaque or stenosis. The LCX gives off 1 patent obtuse
marginal branch.

Right coronary artery: The RCA is dominant with normal take off from
the right coronary cusp. There is minimal calcified plaque (<25%) in
the proximal segment. The mid and distal segments are patent. The
RCA terminates as a PDA and right posterolateral branch without
evidence of plaque or stenosis.

Right Atrium: Right atrial size is within normal limits.

Right Ventricle: The right ventricular cavity is within normal
limits.

Left Atrium: Left atrial size is normal in size with no left atrial
appendage filling defect. A small PFO is present.

Left Ventricle: The ventricular cavity size is within normal limits.
There are no stigmata of prior infarction. There is no abnormal
filling defect.

Pulmonary arteries: Normal in size without proximal filling defect.

Pulmonary veins: Normal pulmonary venous drainage.

Pericardium: Normal thickness with no significant effusion or
calcium present.

Cardiac valves: The aortic valve is trileaflet without significant
calcification. The mitral valve is normal structure without
significant calcification.

Aorta: Normal caliber with no significant disease.

Extra-cardiac findings: See attached radiology report for
non-cardiac structures.
IMPRESSION: 1. Coronary calcium score of 69. This was 64th percentile for age-,
sex, and race-matched controls.

2. Normal coronary origin with right dominance.

3. Moderate mixed density plaque (50-69%) in the proximal LAD.

4. Mild mixed density plaque in the mid LAD (25-49%).

5. Minimal calcified plaque (<25%) in the proximal RCA.

RECOMMENDATIONS:
1. Moderate stenosis in the proximal LAD. Consider symptom-guided
anti-ischemic pharmacotherapy as well as risk factor modification
per guideline directed care. Additional analysis with CT FFR will be
submitted.

*** End of Addendum ***
EXAM:
OVER-READ INTERPRETATION  CT CHEST

The following report is an over-read performed by radiologist Dr.
Jords Barek [REDACTED] on 08/18/2021. This over-read
does not include interpretation of cardiac or coronary anatomy or
pathology. The coronary CTA interpretation by the cardiologist is
attached.
FINDINGS: Vascular: Aortic atherosclerosis. No central pulmonary embolism, on
this non-dedicated study.

Mediastinum/Nodes: No imaged thoracic adenopathy.

Lungs/Pleura: No pleural fluid.  Clear imaged lungs.

Upper Abdomen: Normal imaged portions of the liver, spleen, stomach.

Musculoskeletal: No acute osseous abnormality.
IMPRESSION: No acute findings in the imaged extracardiac chest.

Aortic Atherosclerosis (SIUBC-FCX.X).

## 2022-10-13 DIAGNOSIS — L729 Follicular cyst of the skin and subcutaneous tissue, unspecified: Secondary | ICD-10-CM | POA: Diagnosis not present

## 2022-10-13 DIAGNOSIS — L810 Postinflammatory hyperpigmentation: Secondary | ICD-10-CM | POA: Diagnosis not present

## 2022-11-04 ENCOUNTER — Other Ambulatory Visit: Payer: Self-pay | Admitting: Internal Medicine

## 2022-12-01 DIAGNOSIS — Z23 Encounter for immunization: Secondary | ICD-10-CM | POA: Diagnosis not present

## 2022-12-01 DIAGNOSIS — L089 Local infection of the skin and subcutaneous tissue, unspecified: Secondary | ICD-10-CM | POA: Diagnosis not present

## 2022-12-01 DIAGNOSIS — L723 Sebaceous cyst: Secondary | ICD-10-CM | POA: Diagnosis not present

## 2022-12-08 DIAGNOSIS — L089 Local infection of the skin and subcutaneous tissue, unspecified: Secondary | ICD-10-CM | POA: Diagnosis not present

## 2022-12-08 DIAGNOSIS — L723 Sebaceous cyst: Secondary | ICD-10-CM | POA: Diagnosis not present

## 2022-12-10 DIAGNOSIS — L089 Local infection of the skin and subcutaneous tissue, unspecified: Secondary | ICD-10-CM | POA: Diagnosis not present

## 2022-12-10 DIAGNOSIS — L723 Sebaceous cyst: Secondary | ICD-10-CM | POA: Diagnosis not present

## 2022-12-14 DIAGNOSIS — L089 Local infection of the skin and subcutaneous tissue, unspecified: Secondary | ICD-10-CM | POA: Diagnosis not present

## 2022-12-14 DIAGNOSIS — L723 Sebaceous cyst: Secondary | ICD-10-CM | POA: Diagnosis not present

## 2022-12-14 NOTE — Progress Notes (Unsigned)
Office Visit    Patient Name: Miguel Peters [facility] Date of Encounter: 12/14/2022  Primary Care Provider:  [place], Betty G, MD Primary Cardiologist:  Orbie Pyo, MD Primary Electrophysiologist: None   Past Medical History    Past Medical History:  Diagnosis Date   Arthritis    Bell's palsy 2016   Right sided.  Not clear when acutely suffered this.  Was noted on exam after years of no medical attention 07/2014.  Evaluated by [facility]  subsequently.   Hyperlipidemia    Sickle cell trait (HCC)    Stutter age 48   cannot recall if something happened in life that set this off.   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  Mr. Zelenka was recently seen by Dr. Lynnette Caffey for complaint of chest pain.  He was seen by his PCP and endorsed intermittent chest pain over the last few weeks.  He endorsed occasional episodes of sharp chest pain at rest.  He was started on Imdur and continued on ASA and statin therapy.  He underwent a coronary CTA with FFR that showed calcium score of the morning 25-49% mild stenosis in mid LAD with less than 25% in the proximal RCA.  2D echo was also completed showing EF of 55 to 60% with no RWMA and normal RV function, mild MVR, mild MVR with mild dilation of ascending aorta at 41 mm.  He was last seen on 11/17/2021 for follow-up.  He reported doing well with no further anginal complaints.    Mr. Letona presents today for follow-up and preoperative clearance for upcoming eye surgery.  Since last being seen in the office patient  reports that he has been well with no complaints of chest pain since his previous visit.  His blood pressure today is well-controlled at 116/78 and heart rate is 65 bpm.  He denies any adverse reactions with his current medication regimen and has maintained compliance.  He is scheduled to undergo an eye procedure in a few weeks..  Patient denies chest pain, palpitations, dyspnea, PND, orthopnea, nausea, vomiting, dizziness, syncope, edema, weight gain, or early satiety.  Home Medications    Current Outpatient Medications  Medication Sig Dispense Refill   metoprolol succinate (TOPROL-XL) 25 MG 24 hr tablet TAKE 1 TABLET(25 MG) BY MOUTH AT BEDTIME 90 tablet 0   artificial tears (LACRILUBE) OINT ophthalmic ointment Place into both eyes at bedtime. 5 g 2   aspirin 81 MG tablet Take 81 mg by mouth daily.     atorvastatin (LIPITOR) 40 MG tablet Take 1 tablet (40 mg total) by mouth daily. 90 tablet 3   calcipotriene (DOVONOX) 0.005 % cream APP TOPICALLY TO GROIN AND BUTTOCKS BID     ibuprofen (ADVIL) 200 MG tablet Take 200 mg by mouth every 6 (six) hours as needed.     No current facility-administered medications for this visit.     Review of Systems  Please see the history of present illness.  All other systems reviewed and are otherwise negative except as noted above.  Physical Exam    Wt Readings from Last 3 Encounters:  06/12/22 193 lb 6 oz (87.7 kg)  04/13/22 192 lb 6 oz (87.3 kg)  01/06/22 200 lb (90.7 kg)   ZO:XWRUE were no vitals filed for this visit.,There is no height or weight on file to calculate BMI.  Constitutional:      Appearance: Healthy appearance. Not in distress.  Neck:     Vascular: JVD normal.  Pulmonary:     Effort: Pulmonary effort is normal.     Breath sounds: No wheezing. No rales. Diminished in the bases Cardiovascular:     Normal rate. Regular rhythm. Normal S1. Normal S2.      Murmurs: There is no murmur.  Edema:    Peripheral edema absent.   Abdominal:     Palpations: Abdomen is soft non tender. There is no hepatomegaly.  Skin:    General: Skin is warm and dry.  Neurological:     General: No focal deficit present.     Mental Status: Alert and oriented to person, place and time.     Cranial Nerves: Cranial nerves are intact.  EKG/LABS/ Recent Cardiac Studies    ECG personally reviewed by me today -sinus rhythm with possible LVH with rate of 65 bpm and no acute changes consistent with previous EKG.  Cardiac Studies & Procedures       ECHOCARDIOGRAM  ECHOCARDIOGRAM COMPLETE 08/18/2021  Narrative ECHOCARDIOGRAM REPORT    Patient Name:   Miguel Peters Date of Exam: 08/18/2021 Medical Rec #:  [id number]          Height:       75.0 in Accession #:    [id number]         Weight:       196.0 lb Date of Birth:  06/17/1951          BSA:          2.176 m Patient Age:    70 years           BP:           132/86 mmHg Patient Gender: M                  HR:           72 bpm. Exam Location:  [address]  Procedure: 2D Echo, 3D Echo, Cardiac Doppler, Color Doppler and Strain Analysis  Indications:    R07.9 Chest pain  History:        Patient has no prior history of Echocardiogram examinations. Risk Factors:Dyslipidemia.  Sonographer:    Jorje Guild BS, RDCS Referring Phys: [id number] Charlies Constable [facility]  IMPRESSIONS   1. Global longitudinal strain is -17.0%. Left ventricular ejection fraction, by estimation, is 55 to 60%. The left ventricle has normal function. The left ventricle has no regional wall motion abnormalities. Left ventricular diastolic parameters were normal. 2. Right ventricular systolic function is normal. The right ventricular size is normal. 3. There appears to be very mild prolapse of the anterior miltra leaflet. Mitral regurgitation is mid to late systolic. It is directed posteriorly, toward back of LA. It is probably mild, mild to moderate in severity . The mitral valve is normal in structure. Trivial  mitral valve regurgitation. 4. The aortic valve is tricuspid. Aortic valve regurgitation is mild. Aortic valve sclerosis is present, with no evidence of aortic valve stenosis. 5. Aortic dilatation noted. There  is mild dilatation of the ascending aorta, measuring 41 mm. 6. The inferior vena cava is normal in size with greater than 50% respiratory variability, suggesting right atrial pressure of 3 mmHg.  FINDINGS Left Ventricle: Global longitudinal strain is -17.0%. Left ventricular ejection fraction, by estimation, is 55 to 60%. The left ventricle has normal function. The left ventricle has no regional wall motion abnormalities. The left ventricular internal cavity size was normal in size. There is no left ventricular hypertrophy. Left ventricular diastolic parameters were normal.  Right Ventricle: The right ventricular size is normal. Right vetricular wall thickness was not assessed. Right ventricular systolic function is normal.  Left Atrium: Left atrial size was normal in size.  Right Atrium: Right atrial size was normal in size.  Pericardium: There is no evidence of pericardial effusion.  Mitral Valve: There appears to be very mild prolapse of the anterior miltra leaflet. Mitral regurgitation is mid to late systolic. It is directed posteriorly, toward back of LA. It is probably mild, mild to moderate in severity. The mitral valve is normal in structure. There is mild thickening of the mitral valve leaflet(s). Trivial mitral valve regurgitation.  Tricuspid Valve: The tricuspid valve is grossly normal. Tricuspid valve regurgitation is trivial.  Aortic Valve: The aortic valve is tricuspid. Aortic valve regurgitation is mild. Aortic regurgitation PHT measures 608 msec. Aortic valve sclerosis is present, with no evidence of aortic valve stenosis.  Pulmonic Valve: The pulmonic valve was not well visualized. Pulmonic valve regurgitation is not visualized.  Aorta: The aortic root is normal in  size and structure and aortic dilatation noted. There is mild dilatation of the ascending aorta, measuring 41 mm.  Venous: The inferior vena cava is normal in size with greater than 50% respiratory variability, suggesting right atrial pressure of 3 mmHg.  IAS/Shunts: No atrial level shunt detected by color flow Doppler.   LEFT VENTRICLE PLAX 2D LVIDd:         5.10 cm   Diastology LVIDs:         3.30 cm   LV e' medial:    9.36 cm/s LV PW:         0.80 cm   LV E/e' medial:  7.9 LV IVS:        0.90 cm   LV e' lateral:   12.90 cm/s LVOT diam:     2.50 cm   LV E/e' lateral: 5.8 LV SV:         96 LV SV Index:   44 LVOT Area:     4.91 cm  3D Volume EF: 3D EF:        54 % LV EDV:       123 ml LV ESV:       57 ml LV SV:        66 ml  RIGHT VENTRICLE             IVC RV Basal diam:  3.60 cm     IVC diam: 2.00 cm RV S prime:     13.70 cm/s TAPSE (M-mode): 2.5 cm  LEFT ATRIUM             Index        RIGHT ATRIUM           Index LA diam:        3.50 cm 1.61 cm/m   RA Area:     18.20 cm LA Vol (A2C):   37.7 ml 17.33 ml/m  RA Volume:  49.60 ml  22.80 ml/m LA Vol (A4C):   31.4 ml 14.43 ml/m LA Biplane Vol: 34.4 ml 15.81 ml/m AORTIC VALVE LVOT Vmax:   100.00 cm/s LVOT Vmean:  61.800 cm/s LVOT VTI:    0.195 m AI PHT:      608 msec  AORTA Ao Root diam: 3.70 cm Ao Asc diam:  4.10 cm  MITRAL VALVE SHUNTS MV Decel Time: 218 msec    Systemic VTI:  0.20 m MV E velocity: 74.40 cm/s  Systemic Diam: 2.50 cm MV A velocity: 67.40 cm/s MV E/A ratio:  1.10  Dietrich Pates MD Electronically signed by Dietrich Pates MD Signature Date/Time: 08/18/2021/3:33:18 PM    Final     CT SCANS  CT CORONARY MORPH W/CTA COR W/SCORE 08/18/2021  Addendum 08/18/2021  3:53 PM ADDENDUM REPORT: 08/18/2021 15:51  CLINICAL DATA:  Chest pain  EXAM: Cardiac/Coronary CTA  TECHNIQUE: A non-contrast, gated CT scan was obtained with axial slices of 3 mm through the heart for calcium scoring. Calcium  scoring was performed using the Agatston method. A 120 kV prospective, gated, contrast cardiac scan was obtained. Gantry rotation speed was 250 msecs and collimation was 0.6 mm. Two sublingual nitroglycerin tablets (0.8 mg) were given. The 3D data set was reconstructed in 5% intervals of the 35-75% of the R-R cycle. Diastolic phases were analyzed on a dedicated workstation using MPR, MIP, and VRT modes. The patient received 95 cc of contrast.  FINDINGS: Image quality: Excellent.  Noise artifact is: Limited.  Coronary Arteries:  Normal coronary origin.  Right dominance.  Left main: The left main is a large caliber vessel with a normal take off from the left coronary cusp that trifurcates into a LAD, LCX, and ramus intermedius. There is no plaque or stenosis.  Left anterior descending artery: The proximal LAD contains a moderate mixed density plaque (50-69%). The mid LAD contains mild mixed density plaque (25-49%). The distal LAD contains minimal calcified plaque (<25%). The LAD gives off 1 patent diagonal branch.  Ramus intermedius: Patent with no evidence of plaque or stenosis.  Left circumflex artery: The LCX is non-dominant and patent with no evidence of plaque or stenosis. The LCX gives off 1 patent obtuse marginal branch.  Right coronary artery: The RCA is dominant with normal take off from the right coronary cusp. There is minimal calcified plaque (<25%) in the proximal segment. The mid and distal segments are patent. The RCA terminates as a PDA and right posterolateral branch without evidence of plaque or stenosis.  Right Atrium: Right atrial size is within normal limits.  Right Ventricle: The right ventricular cavity is within normal limits.  Left Atrium: Left atrial size is normal in size with no left atrial appendage filling defect. A small PFO is present.  Left Ventricle: The ventricular cavity size is within normal limits. There are no stigmata of prior  infarction. There is no abnormal filling defect.  Pulmonary arteries: Normal in size without proximal filling defect.  Pulmonary veins: Normal pulmonary venous drainage.  Pericardium: Normal thickness with no significant effusion or calcium present.  Cardiac valves: The aortic valve is trileaflet without significant calcification. The mitral valve is normal structure without significant calcification.  Aorta: Normal caliber with no significant disease.  Extra-cardiac findings: See attached radiology report for non-cardiac structures.  IMPRESSION: 1. Coronary calcium score of 69. This was 64th percentile for age-, sex, and race-matched controls.  2. Normal coronary origin with right dominance.  3. Moderate mixed density plaque (50-69%) in the  proximal LAD.  4. Mild mixed density plaque in the mid LAD (25-49%).  5. Minimal calcified plaque (<25%) in the proximal RCA.  RECOMMENDATIONS: 1. 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 Consider symptom-guided anti-ischemic pharmacotherapy as well as risk factor modification per guideline directed care. Additional analysis with CT FFR will be submitted.  Lennie Odor, MD   Electronically Signed By: Lennie Odor M.D. On: 08/18/2021 15:51  Narrative EXAM: OVER-READ INTERPRETATION  CT CHEST  The following report is an over-read performed by radiologist Dr. Jeronimo Greaves of [facility] on 08/18/2021. This over-read does not include interpretation of cardiac or coronary anatomy or pathology. The coronary CTA interpretation by the cardiologist is attached.  COMPARISON:  Chest radiograph of 07/23/2021  FINDINGS: Vascular: Aortic atherosclerosis. No central pulmonary embolism, on this non-dedicated study.  Mediastinum/Nodes: No imaged thoracic adenopathy.  Lungs/Pleura: No pleural fluid.  Clear imaged lungs.  Upper Abdomen: Normal imaged portions of the liver, spleen, stomach.  Musculoskeletal: No acute  osseous abnormality.  IMPRESSION: No acute findings in the imaged extracardiac chest.  Aortic Atherosclerosis (ICD10-I70.0).  Electronically Signed: By: Jeronimo Greaves M.D. On: 08/18/2021 14:27           Lab Results  Component Value Date   WBC 5.3 07/23/2021   HGB 14.5 07/23/2021   HCT 44.4 07/23/2021   MCV 88.1 07/23/2021   PLT 211.0 07/23/2021   Lab Results  Component Value Date   CREATININE 1.06 07/23/2021   BUN 9 07/23/2021   NA 141 07/23/2021   K 3.5 07/23/2021   CL 103 07/23/2021   CO2 28 07/23/2021   Lab Results  Component Value Date   ALT 14 03/23/2022   AST 14 03/23/2022   ALKPHOS 91 03/23/2022   BILITOT 0.8 03/23/2022   Lab Results  Component Value Date   CHOL 161 03/23/2022   HDL 55 03/23/2022   LDLCALC 93 03/23/2022   TRIG 68 03/23/2022   CHOLHDL 2.9 03/23/2022    No results found for: "HGBA1C"   Assessment & Plan    1.  Nonobstructive CAD: -Cardiac CTA completed due to complaint of chest pain that showed calcium score of  69 with 50-69% stenosis and proximal LAD that was negative by FFR. -Today patient reports that his chest pain has resolved since previous visit. -Continue GDMT with Lipitor 40 mg daily, ASA 81 mg, and Toprol-XL 25 mg daily. -Patient was advised to contact our office and seek care in the ED if chest pain returns.  2.  Hyperlipidemia: -Patient's last LDL cholesterol was 93 -We will check LP(a) today -Continue Lipitor 40 mg daily  3.  Ascending aortic aneurysm: -2D echo completed showing 41 mm ascending aortic aneurysm by echo. -Continue ASA 81 mg and Toprol-XL 25 mg daily  4.  Chest pain: -Patient reports that chest pain has resolved since previous visit. -He was advised and given ED return precautions as noted above. -Continue Toprol-XL and ASA 81 mg as noted above.  5.  Preoperative clearance: -Patient has a RCRI score of 0.4%  The patient affirms he has been doing well without any new cardiac symptoms. They are  able to achieve 7 METS without cardiac limitations. Therefore, based on ACC/AHA guidelines, the patient would be at acceptable risk for the planned procedure without further cardiovascular testing. The patient was advised that if he develops new symptoms prior to surgery to contact our office to arrange for a follow-up visit, and he verbalized understanding.   Disposition: Follow-up with Charlies Constable  Lynnette Caffey, MD or APP in 12 months    Medication Adjustments/Labs and Tests Ordered: Current medicines are reviewed at length with the patient today.  Concerns regarding medicines are outlined above.   Signed, Napoleon Form, Leodis Rains, NP 12/14/2022, 7:37 AM [facility]

## 2022-12-15 ENCOUNTER — Encounter: Payer: Self-pay | Admitting: Nurse Practitioner

## 2022-12-15 ENCOUNTER — Ambulatory Visit: Payer: 59 | Attending: Nurse Practitioner | Admitting: Nurse Practitioner

## 2022-12-15 VITALS — BP 116/78 | HR 65 | Ht 75.0 in | Wt 186.0 lb

## 2022-12-15 DIAGNOSIS — R079 Chest pain, unspecified: Secondary | ICD-10-CM | POA: Diagnosis not present

## 2022-12-15 DIAGNOSIS — I700 Atherosclerosis of aorta: Secondary | ICD-10-CM | POA: Diagnosis not present

## 2022-12-15 DIAGNOSIS — E785 Hyperlipidemia, unspecified: Secondary | ICD-10-CM

## 2022-12-15 DIAGNOSIS — Z01810 Encounter for preprocedural cardiovascular examination: Secondary | ICD-10-CM | POA: Diagnosis not present

## 2022-12-15 DIAGNOSIS — I2510 Atherosclerotic heart disease of native coronary artery without angina pectoris: Secondary | ICD-10-CM

## 2022-12-15 NOTE — Patient Instructions (Signed)
Medication Instructions:  Your physician recommends that you continue on your current medications as directed. Please refer to the Current Medication list given to you today. *If you need a refill on your cardiac medications before your next appointment, please call your pharmacy*   Lab Work: TODAY-LIPOPROTEIN A If you have labs (blood work) drawn today and your tests are completely normal, you will receive your results only by: MyChart Message (if you have MyChart) OR A paper copy in the mail If you have any lab test that is abnormal or we need to change your treatment, we will call you to review the results.   Testing/Procedures: NONE ORDERED   Follow-Up: At [facility], you and your health needs are our priority.  As part of our continuing mission to provide you with exceptional heart care, we have created designated Provider Care Teams.  These Care Teams include your primary Cardiologist (physician) and Advanced Practice Providers (APPs -  Physician Assistants and Nurse Practitioners) who all work together to provide you with the care you need, when you need it.  We recommend signing up for the patient portal called "MyChart".  Sign up information is provided on this After Visit Summary.  MyChart is used to connect with patients for Virtual Visits (Telemedicine).  Patients are able to view lab/test results, encounter notes, upcoming appointments, etc.  Non-urgent messages can be sent to your provider as well.   To learn more about what you can do with MyChart, go to [email or web address].    Your next appointment:   12 month(s)  Provider:   Orbie Pyo, MD     Other Instructions Check your blood pressure daily for 2 weeks, then contact the office with your readings.

## 2022-12-16 LAB — LIPOPROTEIN A (LPA): Lipoprotein (a): 169.8 nmol/L — ABNORMAL HIGH (ref <75.0)

## 2022-12-18 ENCOUNTER — Other Ambulatory Visit: Payer: Self-pay

## 2022-12-18 DIAGNOSIS — E785 Hyperlipidemia, unspecified: Secondary | ICD-10-CM

## 2022-12-18 MED ORDER — EZETIMIBE 10 MG PO TABS: 10.0000 mg | ORAL_TABLET | Freq: Every day | ORAL | 3 refills | Status: DC

## 2023-01-12 ENCOUNTER — Ambulatory Visit (INDEPENDENT_AMBULATORY_CARE_PROVIDER_SITE_OTHER): Payer: 59 | Admitting: Family Medicine

## 2023-01-12 DIAGNOSIS — Z0000 Encounter for general adult medical examination without abnormal findings: Secondary | ICD-10-CM

## 2023-01-12 NOTE — Progress Notes (Addendum)
PATIENT CHECK-IN and HEALTH RISK ASSESSMENT QUESTIONNAIRE:  -completed by phone/video for upcoming Medicare Preventive Visit  Pre-Visit Check-in: 1)Vitals (height, wt, BP, etc) - record in vitals section for visit on day of visit 2)Review and Update Medications, Allergies PMH, Surgeries, Social history in Epic 3)Hospitalizations in the last year with date/reason? no  4)Review and Update Care Team (patient's specialists) in Epic 5) Complete PHQ9 in Epic  6) Complete Fall Screening in Epic 7)Review all Health Maintenance Due and order under PCP if not done.  Medicare Wellness Patient Questionnaire:  Answer theses question about your habits: Do you drink alcohol? 2-3 beers a few days per week  Have you ever smoked? no How many packs a day do/did you smoke? N/a Do you use smokeless tobacco?no Do you use an illicit drugs?no Do you exercises? Walks daily - hour Are you sexually active?  Typical breakfast: toast and tea Typical lunch: sandwich and water, salads Typical dinner:beans, chicken or spaghetti Typical snacks: potato chips, popcorn  Beverages: tea, water  Answer theses question about you: Can you perform most household chores?yes Do you find it hard to follow a conversation in a noisy room? none Do you often ask people to speak up or repeat themselves? none Do you feel that you have a problem with memory? no Do you balance your checkbook and or bank acounts? yes Do you feel safe at home? yes Last dentist visit? Goes once to twice a year Do you need assistance with any of the following: Please note if so - none except where noted  Driving? Does not drive  Feeding yourself?  Getting from bed to chair?  Getting to the toilet?  Bathing or showering?  Dressing yourself?  Managing money?  Climbing a flight of stairs  Preparing meals?    Do you have Advanced Directives in place (Living Will, Healthcare Power or Attorney)? no   Last eye Exam and location? January, 2024  per patient   Do you currently use prescribed or non-prescribed narcotic or opioid pain medications? no  Do you have a history or close family history of breast, ovarian, tubal or peritoneal cancer or a family member with BRCA (breast cancer susceptibility 1 and 2) gene mutations? no  Nurse/Assistant Credentials/time stamp:   ----------------------------------------------------------------------------------------------------------------------------------------------------------------------------------------------------------------------    MEDICARE ANNUAL PREVENTIVE CARE VISIT WITH PROVIDER (Welcome to [employer], initial annual wellness or annual wellness exam)  Virtual Visit via VPhone Note  I connected with Jeanphilippe Kyle Hunkele on 01/12/23  by phone and verified that I am speaking with the correct person using two identifiers.  Location patient: home Location provider:work or home office Persons participating in the virtual visit: patient, provider  Concerns and/or follow up today: none   See HM section in Epic for other details of completed HM.    ROS: negative for report of fevers, unintentional weight loss, vision changes - no but has cataract and seeing eye doctor, vision loss, hearing loss or change, chest pain, sob, hemoptysis, melena, hematochezia, hematuria, falls, bleeding or bruising, thoughts of suicide or self harm, memory loss  Patient-completed extensive health risk assessment - reviewed and discussed with the patient: See Health Risk Assessment completed with patient prior to the visit either above or in recent phone note. This was reviewed in detailed with the patient today and appropriate recommendations, orders and referrals were placed as needed per Summary below and patient instructions.   Review of Medical History: -PMH, PSH, Family History and current specialty and care providers reviewed and updated and  listed below   Patient Care Team: [place], Betty G, MD  as PCP - General (Family Medicine) Orbie Pyo, MD as PCP - Cardiology (Cardiology)   Past Medical History:  Diagnosis Date   Arthritis    Bell's palsy 2016   Right sided.  Not clear when acutely suffered this.  Was noted on exam after years of no medical attention 07/2014.  Evaluated by [facility]  subsequently.   Hyperlipidemia    Sickle cell trait (HCC)    Stutter age 56   cannot recall if something happened in life that set this off.   Weight loss 02/12/2017    Past Surgical History:  Procedure Laterality Date   COLONOSCOPY  10/05/2014   db 3 TAs   COLONOSCOPY  01/25/2020   hand surgery     thumb   POLYPECTOMY     TONSILLECTOMY      Social History   Socioeconomic History   Marital status: Single    Spouse name: Not on file   Number of children: 0   Years of education: 12   Highest education level: 12th grade  Occupational History   Occupation: Maintenance/Housekeeping    Employer: [employer]    Comment: 2016  Tobacco Use   Smoking status: Never   Smokeless tobacco: Never  Vaping Use   Vaping Use: Never used  Substance and Sexual Activity   Alcohol use: Yes    Alcohol/week: 0.0 standard drinks of alcohol    Comment: Once a monthly   Drug use: No   Sexual activity: Yes    Partners: Female  Other Topics Concern   Not on file  Social History Narrative   Was living at a boarding house.   Patient was having difficulty affording food--reportedly because he lived in boarding house and did not have utilitiy bills, his EBT dollars were cut to $13 to $15 per month.   That has been changed.  He now receives adequate EBT dollars.      Now living at [facility].  This is for folks with a disability.  He has an apartment now with a kitchen.  The [facility] near him set him up in these apartments that they support.     Margaretann Loveless [phone]      09/05/18: Pt. Still living at [facility], likes it. Sometimes participates in events  hosted by center there.   Lives alone, but sister local, and main source of support.   Enjoys doing own workout routine with weights and walking. Enjoys reading.      Social Determinants of Health   Financial Resource Strain: Low Risk  (01/06/2022)   Overall Financial Resource Strain (CARDIA)    Difficulty of Paying Living Expenses: Not hard at all  Food Insecurity: No Food Insecurity (01/06/2022)   Hunger Vital Sign    Worried About Running Out of Food in the Last Year: Never true    Ran Out of Food in the Last Year: Never true  Transportation Needs: No Transportation Needs (01/06/2022)   PRAPARE - Administrator, Civil Service (Medical): No    Lack of Transportation (Non-Medical): No  Physical Activity: Sufficiently Active (01/06/2022)   Exercise Vital Sign    Days of Exercise per Week: 7 days    Minutes of Exercise per Session: 30 min  Stress: No Stress Concern Present (01/06/2022)   [employer] of Occupational Health - Occupational Stress Questionnaire    Feeling of Stress : Not  at all  Social Connections: Socially Isolated (11/05/2020)   Social Connection and Isolation Panel [NHANES]    Frequency of Communication with Friends and Family: Once a week    Frequency of Social Gatherings with Friends and Family: Once a week    Attends Religious Services: Never    Database administrator or Organizations: No    Attends Banker Meetings: Never    Marital Status: Never married  Intimate Partner Violence: Not At Risk (11/05/2020)   Humiliation, Afraid, Rape, and Kick questionnaire    Fear of Current or Ex-Partner: No    Emotionally Abused: No    Physically Abused: No    Sexually Abused: No    Family History  Problem Relation Age of Onset   Sickle cell anemia Mother    Kidney disease Father    Alcohol abuse Father    Colon cancer Neg Hx    Esophageal cancer Neg Hx    Stomach cancer Neg Hx    Rectal cancer Neg Hx    Colon polyps Neg Hx     Current  Outpatient Medications on File Prior to Visit  Medication Sig Dispense Refill   ezetimibe (ZETIA) 10 MG tablet Take 1 tablet (10 mg total) by mouth daily. 30 tablet 3   artificial tears (LACRILUBE) OINT ophthalmic ointment Place into both eyes at bedtime. 5 g 2   aspirin 81 MG tablet Take 81 mg by mouth daily.     atorvastatin (LIPITOR) 40 MG tablet Take 1 tablet (40 mg total) by mouth daily. 90 tablet 3   calcipotriene (DOVONOX) 0.005 % cream APP TOPICALLY TO GROIN AND BUTTOCKS BID     ibuprofen (ADVIL) 200 MG tablet Take 200 mg by mouth every 6 (six) hours as needed.     metoprolol succinate (TOPROL-XL) 25 MG 24 hr tablet TAKE 1 TABLET(25 MG) BY MOUTH AT BEDTIME 90 tablet 0   No current facility-administered medications on file prior to visit.    No Known Allergies     Physical Exam There were no vitals filed for this visit. Estimated body mass index is 23.25 kg/m as calculated from the following:   Height as of 12/15/22: 6\' 3"  (1.905 m).   Weight as of 12/15/22: 186 lb (84.4 kg).  EKG (optional): deferred due to virtual visit  GENERAL: alert, oriented, no acute distress detected; full vision exam deferred due to pandemic and/or virtual encounter  PSYCH/NEURO: pleasant and cooperative, no obvious depression or anxiety, speech and thought processing grossly intact, Cognitive function grossly intact  Flowsheet Row Office Visit from [facility]/23/20[facility] in [facility]  PHQ-9 Total Score 2           01/12/2023   10:36 AM 06/12/2022   11:12 AM 04/13/2022    3:02 PM 01/06/2022    9:19 AM 07/23/2021   10:41 PM  Depression screen PHQ 2/9  Decreased Interest 0 0 0 0 2  Down, Depressed, Hopeless 0 0 0 0 0  PHQ - 2 Score 0 0 0 0 2  Altered sleeping     0  Tired, decreased energy     0  Change in appetite     0  Feeling bad or failure about yourself      0  Trouble concentrating     0  Moving slowly or fidgety/restless     0  Suicidal thoughts     0   PHQ-9 Score     2  Difficult  doing work/chores     Not difficult at all       07/23/2021   10:40 PM 01/06/2022    9:19 AM 04/13/2022    3:02 PM 06/12/2022   11:12 AM 01/12/2023   10:36 AM  Fall Risk  Falls in the past year? 0 0 0 0 0  Was there an injury with Fall? 0 0 0 0 0  Fall Risk Category Calculator 0 0 0 0 0  Fall Risk Category (Retired) Low Low Low Low   (RETIRED) Patient Fall Risk Level Low fall risk Low fall risk Low fall risk Low fall risk   Patient at Risk for Falls Due to  Medication side effect Other (Comment) Other (Comment) No Fall Risks  Fall risk Follow up Education provided Falls evaluation completed;Education provided;Falls prevention discussed Falls evaluation completed Falls evaluation completed      SUMMARY AND PLAN:  Encounter for Medicare annual wellness exam   Discussed applicable health maintenance/preventive health measures and advised and referred or ordered per patient preferences:  Health Maintenance  Topic Date Due   COVID-19 Vaccine (5 - 2023-24 season) 03/27/2022   INFLUENZA VACCINE  02/25/2023   Medicare Annual Wellness (AWV)  01/12/2024   DTaP/Tdap/Td (2 - Td or Tdap) 06/23/2027   Pneumonia Vaccine 52+ Years old  Completed   Hepatitis C Screening  Completed   Zoster Vaccines- Shingrix  Completed   HPV VACCINES  Aged Out   Colonoscopy  Discontinued    Education and counseling on the following was provided based on the above review of health and a plan/checklist for the patient, along with additional information discussed, was provided for the patient in the patient instructions :  -Advised on importance of completing advanced directives, discussed options for completing and provided information in patient instructions as well -Advised and counseled on a healthy lifestyle - including the importance of a healthy diet, regular physical activity, social connections and stress management. -Reviewed patient's current diet. Advised and  counseled on a whole foods based healthy diet. A summary of a healthy diet was provided in the Patient Instructions.  -reviewed patient's current physical activity level and discussed exercise guidelines for adults. Discussed community resources and ideas for safe exercise at home to assist in meeting exercise guideline recommendations in a safe and healthy way.  -Advise yearly dental visits at minimum and regular eye exams -Advised and counseled on alcohol safe limits  Follow up: see patient instructions   Patient Instructions  I really enjoyed getting to talk with you today! I am available on Tuesdays and Thursdays for virtual visits if you have any questions or concerns, or if I can be of any further assistance.   CHECKLIST FROM ANNUAL WELLNESS VISIT:  -Follow up (please call to schedule if not scheduled after visit):   -In office exam: Sept 20th @ 9:00 - please arrive at 8:45 to get checked in before your appointment.    -yearly for annual wellness visit with primary care office  Here is a list of your preventive care/health maintenance measures and the plan for each if any are due:  PLAN For any measures below that may be due:  -can get the covid booster at the pharmacy - please let us know when you do.  Health Maintenance  Topic Date Due   COVID-19 Vaccine (5 - 2023-24 season) 03/27/2022   INFLUENZA VACCINE  02/25/2023   Medicare Annual Wellness (AWV)  01/12/2024   DTaP/Tdap/Td (2 - Td or Tdap) 06/23/2027  Pneumonia Vaccine 20+ Years old  Completed   Hepatitis C Screening  Completed   Zoster Vaccines- Shingrix  Completed   HPV VACCINES  Aged Out   Colonoscopy  Discontinued    -See a dentist at least yearly  -Get your eyes checked and then per your eye specialist's recommendations  -Other issues addressed today:   - please limit alcohol to no more than 2 regular size drinks in any given 24 hour day   -I have included below further information regarding a healthy  whole foods based diet, physical activity guidelines for adults, stress management and opportunities for social connections. I hope you find this information useful.   -----------------------------------------------------------------------------------------------------------------------------------------------------------------------------------------------------------------------------------------------------------  NUTRITION: -eat real food: lots of colorful vegetables (half the plate) and fruits -5-7 servings of vegetables and fruits per day (fresh or steamed is best), exp. 2 servings of vegetables with lunch and dinner and 2 servings of fruit per day. Berries and greens such as kale and collards are great choices.  -consume on a regular basis: whole grains (make sure first ingredient on label contains the word "whole"), fresh fruits, fish, nuts, seeds, healthy oils (such as olive oil, avocado oil, grape seed oil) -may eat small amounts of dairy and lean meat on occasion, but avoid processed meats such as ham, bacon, lunch meat, etc. -drink water -try to avoid fast food and pre-packaged foods, processed meat -most experts advise limiting sodium to < 2300mg  per day, should limit further is any chronic conditions such as high blood pressure, heart disease, diabetes, etc. The American Heart Association advised that < 1500mg  is is ideal -try to avoid foods that contain any ingredients with names you do not recognize  -try to avoid sugar/sweets (except for the natural sugar that occurs in fresh fruit) -try to avoid sweet drinks -try to avoid white rice, white bread, pasta (unless whole grain), white or yellow potatoes  EXERCISE GUIDELINES FOR ADULTS: -if you wish to increase your physical activity, do so gradually and with the approval of your doctor -STOP and seek medical care immediately if you have any chest pain, chest discomfort or trouble breathing when starting or increasing exercise  -move  and stretch your body, legs, feet and arms when sitting for long periods -Physical activity guidelines for optimal health in adults: -least 150 minutes per week of aerobic exercise (can talk, but not sing) once approved by your doctor, 20-30 minutes of sustained activity or two 10 minute episodes of sustained activity every day.  -resistance training at least 2 days per week if approved by your doctor -balance exercises 3+ days per week:   Stand somewhere where you have something sturdy to hold onto if you lose balance.    1) lift up on toes, start with 5x per day and work up to 20x   2) stand and lift on leg straight out to the side so that foot is a few inches of the floor, start with 5x each side and work up to 20x each side   3) stand on one foot, start with 5 seconds each side and work up to 20 seconds on each side  If you need ideas or help with getting more active:  -Silver sneakers https://tools.silversneakers.com  -Walk with a Doc: http://www.duncan-williams.com/  -try to include resistance (weight lifting/strength building) and balance exercises twice per week: or the following link for ideas: http://castillo-powell.com/  BuyDucts.dk  STRESS MANAGEMENT: -can try meditating, or just sitting quietly with deep breathing while intentionally relaxing all parts  of your body for 5 minutes daily -if you need further help with stress, anxiety or depression please follow up with your primary doctor or contact the wonderful folks at [facility]: [phone]  SOCIAL CONNECTIONS: -options in [place] if you wish to engage in more social and exercise related activities:  -Silver sneakers https://tools.silversneakers.com  -Walk with a Doc: http://www.duncan-williams.com/  -Check out the [facility] Active Adults 50+ section on the [place] [employer] (hiking clubs, book clubs, cards and  games, chess, exercise classes, aquatic classes and much more) - see the website for details: https://www.[place].gov/departments/parks-recreation/active-adults50  -YouTube has lots of exercise videos for different ages and abilities as well  -[facility] (a variety of indoor and outdoor inperson activities for adults). [phone]. [address].  -Virtual Online Classes (a variety of topics): see seniorplanet.org or call [phone]  -consider volunteering at a school, hospice center, church, senior center or elsewhere  ADVANCED HEALTHCARE DIRECTIVES:  Everyone should have advanced health care directives in place. This is so that you get the care you want, should you ever be in a situation where you are unable to make your own medical decisions.   From the [place] Advanced Directive Website: "Advance Health Care Directives are legal documents in which you give written instructions about your health care if, in the future, you cannot speak for yourself.   A health care power of attorney allows you to name a person you trust to make your health care decisions if you cannot make them yourself. A declaration of a desire for a natural death (or living will) is document, which states that you desire not to have your life prolonged by extraordinary measures if you have a terminal or incurable illness or if you are in a vegetative state. An advance instruction for mental health treatment makes a declaration of instructions, information and preferences regarding your mental health treatment. It also states that you are aware that the advance instruction authorizes a mental health treatment provider to act according to your wishes. It may also outline your consent or refusal of mental health treatment. A declaration of an anatomical gift allows anyone over the age of 64 to make a gift by will, organ donor card or other document."   Please see the following website or an elder  law attorney for forms, FAQs and for completion of advanced directives: [place] TEFL teacher Health Care Directives Advance Health Care Directives (http://guzman.com/)  Or copy and paste the following to your web browser: PoshChat.fi           Terressa Koyanagi, DO

## 2023-01-12 NOTE — Patient Instructions (Addendum)
I really enjoyed getting to talk with you today! I am available on Tuesdays and Thursdays for virtual visits if you have any questions or concerns, or if I can be of any further assistance.   CHECKLIST FROM ANNUAL WELLNESS VISIT:  -Follow up (please call to schedule if not scheduled after visit):   -In office exam: Sept 20th @ 9:00 - please arrive at 8:45 to get checked in before your appointment.    -yearly for annual wellness visit with primary care office  Here is a list of your preventive care/health maintenance measures and the plan for each if any are due:  PLAN For any measures below that may be due:  -can get the covid booster at the pharmacy - please let us know when you do.  Health Maintenance  Topic Date Due   COVID-19 Vaccine (5 - 2023-24 season) 03/27/2022   INFLUENZA VACCINE  02/25/2023   Medicare Annual Wellness (AWV)  01/12/2024   DTaP/Tdap/Td (2 - Td or Tdap) 06/23/2027   Pneumonia Vaccine 11+ Years old  Completed   Hepatitis C Screening  Completed   Zoster Vaccines- Shingrix  Completed   HPV VACCINES  Aged Out   Colonoscopy  Discontinued    -See a dentist at least yearly  -Get your eyes checked and then per your eye specialist's recommendations  -Other issues addressed today:   - please limit alcohol to no more than 2 regular size drinks in any given 24 hour day   -I have included below further information regarding a healthy whole foods based diet, physical activity guidelines for adults, stress management and opportunities for social connections. I hope you find this information useful.   -----------------------------------------------------------------------------------------------------------------------------------------------------------------------------------------------------------------------------------------------------------  NUTRITION: -eat real food: lots of colorful vegetables (half the plate) and fruits -5-7 servings of vegetables and  fruits per day (fresh or steamed is best), exp. 2 servings of vegetables with lunch and dinner and 2 servings of fruit per day. Berries and greens such as kale and collards are great choices.  -consume on a regular basis: whole grains (make sure first ingredient on label contains the word "whole"), fresh fruits, fish, nuts, seeds, healthy oils (such as olive oil, avocado oil, grape seed oil) -may eat small amounts of dairy and lean meat on occasion, but avoid processed meats such as ham, bacon, lunch meat, etc. -drink water -try to avoid fast food and pre-packaged foods, processed meat -most experts advise limiting sodium to < 2300mg  per day, should limit further is any chronic conditions such as high blood pressure, heart disease, diabetes, etc. The American Heart Association advised that < 1500mg  is is ideal -try to avoid foods that contain any ingredients with names you do not recognize  -try to avoid sugar/sweets (except for the natural sugar that occurs in fresh fruit) -try to avoid sweet drinks -try to avoid white rice, white bread, pasta (unless whole grain), white or yellow potatoes  EXERCISE GUIDELINES FOR ADULTS: -if you wish to increase your physical activity, do so gradually and with the approval of your doctor -STOP and seek medical care immediately if you have any chest pain, chest discomfort or trouble breathing when starting or increasing exercise  -move and stretch your body, legs, feet and arms when sitting for long periods -Physical activity guidelines for optimal health in adults: -least 150 minutes per week of aerobic exercise (can talk, but not sing) once approved by your doctor, 20-30 minutes of sustained activity or two 10 minute episodes of sustained activity every  day.  -resistance training at least 2 days per week if approved by your doctor -balance exercises 3+ days per week:   Stand somewhere where you have something sturdy to hold onto if you lose balance.    1) lift  up on toes, start with 5x per day and work up to 20x   2) stand and lift on leg straight out to the side so that foot is a few inches of the floor, start with 5x each side and work up to 20x each side   3) stand on one foot, start with 5 seconds each side and work up to 20 seconds on each side  If you need ideas or help with getting more active:  -Silver sneakers https://tools.silversneakers.com  -Walk with a Doc: http://www.duncan-williams.com/  -try to include resistance (weight lifting/strength building) and balance exercises twice per week: or the following link for ideas: http://castillo-powell.com/  BuyDucts.dk  STRESS MANAGEMENT: -can try meditating, or just sitting quietly with deep breathing while intentionally relaxing all parts of your body for 5 minutes daily -if you need further help with stress, anxiety or depression please follow up with your primary doctor or contact the wonderful folks at [facility]: [phone]  SOCIAL CONNECTIONS: -options in [place] if you wish to engage in more social and exercise related activities:  -Silver sneakers https://tools.silversneakers.com  -Walk with a Doc: http://www.duncan-williams.com/  -Check out the [facility] Active Adults 50+ section on the [place] [employer] (hiking clubs, book clubs, cards and games, chess, exercise classes, aquatic classes and much more) - see the website for details: https://www.[place].gov/departments/parks-recreation/active-adults50  -YouTube has lots of exercise videos for different ages and abilities as well  -[facility] (a variety of indoor and outdoor inperson activities for adults). [phone]. [address].  -Virtual Online Classes (a variety of topics): see seniorplanet.org or call [phone]  -consider volunteering at a school, hospice center, church, senior  center or elsewhere  ADVANCED HEALTHCARE DIRECTIVES:  Everyone should have advanced health care directives in place. This is so that you get the care you want, should you ever be in a situation where you are unable to make your own medical decisions.   From the [place] Advanced Directive Website: "Advance Health Care Directives are legal documents in which you give written instructions about your health care if, in the future, you cannot speak for yourself.   A health care power of attorney allows you to name a person you trust to make your health care decisions if you cannot make them yourself. A declaration of a desire for a natural death (or living will) is document, which states that you desire not to have your life prolonged by extraordinary measures if you have a terminal or incurable illness or if you are in a vegetative state. An advance instruction for mental health treatment makes a declaration of instructions, information and preferences regarding your mental health treatment. It also states that you are aware that the advance instruction authorizes a mental health treatment provider to act according to your wishes. It may also outline your consent or refusal of mental health treatment. A declaration of an anatomical gift allows anyone over the age of 82 to make a gift by will, organ donor card or other document."   Please see the following website or an elder law attorney for forms, FAQs and for completion of advanced directives: [place] TEFL teacher Health Care Directives Advance Health Care Directives (http://guzman.com/)  Or copy and  paste the following to your web browser: PoshChat.fi

## 2023-01-21 ENCOUNTER — Other Ambulatory Visit: Payer: Self-pay | Admitting: Internal Medicine

## 2023-01-21 ENCOUNTER — Other Ambulatory Visit: Payer: Self-pay | Admitting: Pharmacist

## 2023-01-21 ENCOUNTER — Ambulatory Visit: Payer: 59 | Attending: Cardiology | Admitting: Pharmacist

## 2023-01-21 DIAGNOSIS — E785 Hyperlipidemia, unspecified: Secondary | ICD-10-CM

## 2023-01-21 NOTE — Patient Instructions (Addendum)
I will submit information to [facility] service center to confirm your coverage of this medication. You will get a call from the [employer] to schedule. Their address is: [address] [place],  [place]  [address]  Wilber Bihari is given at 0 months, 3 months and then every 6 months. It should lower your LDL-C around 50%.  Please call me at [phone] with any questions    Tips for living a healthier life     Building a Healthy and Balanced Diet Make most of your meal vegetables and fruits -  of your plate. Aim for color and variety, and remember that potatoes don't count as vegetables on the Healthy Eating Plate because of their negative impact on blood sugar.  Go for whole grains -  of your plate. Whole and intact grains--whole wheat, barley, wheat berries, quinoa, oats, brown rice, and foods made with them, such as whole wheat pasta--have a milder effect on blood sugar and insulin than white bread, white rice, and other refined grains.  Protein power -  of your plate. Fish, poultry, beans, and nuts are all healthy, versatile protein sources--they can be mixed into salads, and pair well with vegetables on a plate. Limit red meat, and avoid processed meats such as bacon and sausage.  Healthy plant oils - in moderation. Choose healthy vegetable oils like olive, canola, soy, corn, sunflower, peanut, and others, and avoid partially hydrogenated oils, which contain unhealthy trans fats. Remember that low-fat does not mean "healthy."  Drink water, coffee, or tea. Skip sugary drinks, limit milk and dairy products to one to two servings per day, and limit juice to a small glass per day.  Stay active. The red figure running across the Healthy Eating Plate's placemat is a reminder that staying active is also important in weight control.  The main message of the Healthy Eating Plate is to focus on diet quality:  The type of carbohydrate in the diet is more important than  the amount of carbohydrate in the diet, because some sources of carbohydrate--like vegetables (other than potatoes), fruits, whole grains, and beans--are healthier than others. The Healthy Eating Plate also advises consumers to avoid sugary beverages, a major source of calories--usually with little nutritional value--in the American diet. The Healthy Eating Plate encourages consumers to use healthy oils, and it does not set a maximum on the percentage of calories people should get each day from healthy sources of fat. In this way, the Healthy Eating Plate recommends the opposite of the low-fat message promoted for decades by the USDA.  CueTune.com.ee  SUGAR  Sugar is a huge problem in the modern day diet. Sugar is a big contributor to heart disease, diabetes, high triglyceride levels, fatty liver disease and obesity. Sugar is hidden in almost all packaged foods/beverages. Added sugar is extra sugar that is added beyond what is naturally found and has no nutritional benefit for your body. The American Heart Association recommends limiting added sugars to no more than 25g for women and 36 grams for men per day. There are many names for sugar including maltose, sucrose (names ending in "ose"), high fructose corn syrup, molasses, cane sugar, corn sweetener, raw sugar, syrup, honey or fruit juice concentrate.   One of the best ways to limit your added sugars is to stop drinking sweetened beverages such as soda, sweet tea, and fruit juice.  There is 65g of added sugars in one 20oz bottle of Coke! That is equal to 7.5 donuts.  Pay attention and read all nutrition facts labels. Below is an examples of a nutrition facts label. The #1 is showing you the total sugars where the # 2 is showing you the added sugars. This one serving has almost the max amount of added sugars per day!   EXERCISE  Exercise is good. We've all heard that. In an ideal world, we  would all have time and resources to get plenty of it. When you are active, your heart pumps more efficiently and you will feel better.  Multiple studies show that even walking regularly has benefits that include living a longer life. The American Heart Association recommends 150 minutes per week of exercise (30 minutes per day most days of the week). You can do this in any increment you wish. Nine or more 10-minute walks count. So does an hour-long exercise class. Break the time apart into what will work in your life. Some of the best things you can do include walking briskly, jogging, cycling or swimming laps. Not everyone is ready to "exercise." Sometimes we need to start with just getting active. Here are some easy ways to be more active throughout the day:  Take the stairs instead of the elevator  Go for a 10-15 minute walk during your lunch break (find a friend to make it more enjoyable)  When shopping, park at the back of the parking lot  If you take public transportation, get off one stop early and walk the extra distance  Pace around while making phone calls  Check with your doctor if you aren't sure what your limitations may be. Always remember to drink plenty of water when doing any type of exercise. Don't feel like a failure if you're not getting the 90-150 minutes per week. If you started by being a couch potato, then just a 10-minute walk each day is a huge improvement. Start with little victories and work your way up.   HEALTHY EATING TIPS              Plan ahead: make a menu of the meals for a week then create a grocery list to go with that menu. Consider meals that easily stretch into a night of leftovers, such as stews or casseroles. Or consider making two of your favorite meal and put one in the freezer for another night. Try a night or two each week that is "meatless" or "no cook" such as salads. When you get home from the grocery store wash and prepare your vegetables and fruits. Then  when you need them they are ready to go.   Tips for going to the grocery store:  Buy store or generic brands  Check the weekly ad from your store on-line or in their in-store flyer  Look at the unit price on the shelf tag to compare/contrast the costs of different items  Buy fruits/vegetables in season  Carrots, bananas and apples are low-cost, naturally healthy items  If meats or frozen vegetables are on sale, buy some extras and put in your freezer  Limit buying prepared or "ready to eat" items, even if they are pre-made salads or fruit snacks  Do not shop when you're hungry  Foods at eye level tend to be more expensive. Look on the high and low shelves for deals.  Consider shopping at the farmer's market for fresh foods in season.  Avoid the cookie and chip aisles (these are expensive, high in calories and low in nutritional value). Shop on the outside  of the grocery store.  Healthy food preparations:  If you can't get lean hamburger, be sure to drain the fat when cooking  Steam, saut (in olive oil), grill or bake foods  Experiment with different seasonings to avoid adding salt to your foods. Kosher salt, sea salt and Himalayan salt are all still salt and should be avoided. Try seasoning food with onion, garlic, thyme, rosemary, basil ect. Onion powder or garlic powder is ok. Avoid if it says salt (ie garlic salt).

## 2023-01-21 NOTE — Progress Notes (Signed)
Patient ID: Miguel Peters [facility]                 DOB: 13-Aug-1950                    MRN: [id number]      HPI: Miguel Peters is a 72 y.o. male patient of Dr. Trula Ore referred to lipid clinic by Robin Searing, NP. 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 LP(a) was checked in May and resulted elevated at 169 nmol/L. Ezetimibe 10mg  daily was added to his atorvastatin and he was referred to lipid clinic.   Patient presents today to lipid clinic.  He reports tolerating atorvastatin and ezetimibe well.  He states that he walks about half a mile to a mile every day, however he says it takes him about 30 to 45 minutes but states he feels as though he walks at a fast pace.  He eats breakfast out only about 1-2 times per month and tries to avoid fried foods.  Initially discussed PCSK9 therapy. Reviewed injection technique, efficacy and effect on LP(a).  However patient was not interested in self injecting a medication.  Therefore we discussed both Nexletol and Leqvio.  Patient has both Medicare and Medicaid.   Current Medications: atorvastatin 40mg  daily, ezetimibe 10mg  daily Intolerances:  Risk Factors: Lp(a) >125, CAD on CT LDL-C goal: at least less than 70, ideally <55 ApoB goal: <70  Diet:  Breakfast: toast and sausage and baccon, but doesn't eat breakfast everyday Lunch: bolonge and ham sandwich or salad Dinner: baked chicken, spaghetti, tries to stay away from fried foods, string beans, pinto beans Apples pears Eats out 1-2 times per month  Exercise: walks daily- 30-45 min at a decent pace per patient  Family History:  Family History  Problem Relation Age of Onset   Sickle cell anemia Mother    Kidney disease Father     Alcohol abuse Father    Colon cancer Neg Hx    Esophageal cancer Neg Hx    Stomach cancer Neg Hx    Rectal cancer Neg Hx    Colon polyps Neg Hx     Social History: no tobacco, beer 2-3 cans of beer a few times a week  Labs: Lipid Panel     Component Value Date/Time   CHOL 161 03/23/2022 1023   TRIG 68 03/23/2022 1023   HDL 55 03/23/2022 1023   CHOLHDL 2.9 03/23/2022 1023   CHOLHDL 4 03/26/2021 0756   VLDL 20.4 03/26/2021 0756   LDLCALC 93 03/23/2022 1023   LABVLDL 13 03/23/2022 1023    Past Medical History:  Diagnosis Date   Arthritis    Bell's palsy 2016   Right sided.  Not clear when acutely suffered this.  Was noted on exam after years of no medical attention 07/2014.  Evaluated by [facility]  subsequently.   Hyperlipidemia    Sickle cell trait (HCC)    Stutter age 38   cannot recall if something happened in life that set this off.   Weight loss 02/12/2017    Current Outpatient Medications on File Prior to Visit  Medication Sig Dispense Refill   ezetimibe (ZETIA) 10 MG tablet Take 1 tablet (10 mg total) by mouth daily.  30 tablet 3   artificial tears (LACRILUBE) OINT ophthalmic ointment Place into both eyes at bedtime. 5 g 2   aspirin 81 MG tablet Take 81 mg by mouth daily.     atorvastatin (LIPITOR) 40 MG tablet Take 1 tablet (40 mg total) by mouth daily. 90 tablet 3   calcipotriene (DOVONOX) 0.005 % cream APP TOPICALLY TO GROIN AND BUTTOCKS BID     ibuprofen (ADVIL) 200 MG tablet Take 200 mg by mouth every 6 (six) hours as needed.     metoprolol succinate (TOPROL-XL) 25 MG 24 hr tablet TAKE 1 TABLET(25 MG) BY MOUTH AT BEDTIME 90 tablet 0   No current facility-administered medications on file prior to visit.    No Known Allergies  Assessment/Plan:  1. Hyperlipidemia -  Hyperlipidemia Assessment: Last labs in August 2023 showed an LDL-C of 93 Ezetimibe 10 mg daily was added since then.  I doubt LDL-C is at goal even with the addition of  ezetimibe Patient tolerating both medications well Patient not interested in self administering Repatha We discussed Nexletol and Leqvio and its efficacy. Patient is agreeable to [facility] treatment.  He has both Medicare and Medicaid therefore it should be covered well Side effects of Leqvio were reviewed including injection site reaction  Plan: Check LDL-C and LFTs today since they have not been checked since starting ezetimibe Patient has signed Leqvio start form, will submit Will refer to [facility] infusion center for injections Encourage patient to limit processed meats in his diet and recommended using extra virgin olive oil Repeat labs after second Leqvio injection Continue atorvastatin 40 mg daily and ezetimibe 10 mg daily    Thank you,   D , Pharm.D, BCPS, CPP [facility] of [facility] [address], [place], [place] [address]  Phone: [phone]; Fax: [phone]

## 2023-01-21 NOTE — Assessment & Plan Note (Signed)
Assessment: Last labs in August 2023 showed an LDL-C of 93 Ezetimibe 10 mg daily was added since then.  I doubt LDL-C is at goal even with the addition of ezetimibe Patient tolerating both medications well Patient not interested in self administering Repatha We discussed Nexletol and Leqvio and its efficacy. Patient is agreeable to [facility] treatment.  He has both Medicare and Medicaid therefore it should be covered well Side effects of Leqvio were reviewed including injection site reaction  Plan: Check LDL-C and LFTs today since they have not been checked since starting ezetimibe Patient has signed Leqvio start form, will submit Will refer to [facility] infusion center for injections Encourage patient to limit processed meats in his diet and recommended using extra virgin olive oil Repeat labs after second Leqvio injection Continue atorvastatin 40 mg daily and ezetimibe 10 mg daily

## 2023-01-21 NOTE — Progress Notes (Signed)
Start form submitted 01/21/23. Medicare and Medicaid

## 2023-01-22 LAB — HEPATIC FUNCTION PANEL
ALT: 12 IU/L (ref 0–44)
AST: 18 IU/L (ref 0–40)
Albumin: 4.6 g/dL (ref 3.9–4.9)
Alkaline Phosphatase: 88 IU/L (ref 44–121)
Bilirubin Total: 0.9 mg/dL (ref 0.0–1.2)
Bilirubin, Direct: 0.23 mg/dL (ref 0.00–0.40)
Total Protein: 6.9 g/dL (ref 6.0–8.5)

## 2023-01-22 LAB — LIPID PANEL
Chol/HDL Ratio: 2.7 ratio (ref 0.0–5.0)
Cholesterol, Total: 141 mg/dL (ref 100–199)
HDL: 53 mg/dL (ref >39)
LDL Chol Calc (NIH): 73 mg/dL (ref 0–99)
Triglycerides: 76 mg/dL (ref 0–149)
VLDL Cholesterol Cal: 15 mg/dL (ref 5–40)

## 2023-01-27 ENCOUNTER — Other Ambulatory Visit: Payer: Self-pay

## 2023-01-29 ENCOUNTER — Telehealth: Payer: Self-pay | Admitting: Pharmacy Technician

## 2023-01-29 DIAGNOSIS — E785 Hyperlipidemia, unspecified: Secondary | ICD-10-CM

## 2023-01-29 NOTE — Telephone Encounter (Signed)
Miguel Peters has been denied.  The denial letter has been scanned to the media tab for your review.  Auth Submission: DENIED Site of care: Site of care: [facility] Payer: [facility] MEDICARE Medication & CPT/J Code(s) submitted: Leqvio (Inclisiran) [phone] Route of submission (phone, fax, portal): PORTAL Phone # Fax # Auth type: Buy/Bill Units/visits requested:  Reference number:  Approval from:  to

## 2023-02-01 ENCOUNTER — Other Ambulatory Visit (HOSPITAL_COMMUNITY): Payer: Self-pay | Admitting: Pharmacy Technician

## 2023-02-01 NOTE — Addendum Note (Signed)
Addended by: Malena Peer D on: 02/01/2023 04:26 PM   Modules accepted: Orders

## 2023-02-01 NOTE — Telephone Encounter (Signed)
Nexlizet denied. Insurance stating he needed to try another statin but he is already on a high intensity statin. Appeals submitted.

## 2023-02-01 NOTE — Telephone Encounter (Signed)
Patient made aware that Miguel Peters was denied. He does not want to do PCSK9i. Willing to try nexlizet. Will see if insurance will cover.  PA submitted Key: [id number]

## 2023-02-02 ENCOUNTER — Other Ambulatory Visit: Payer: Self-pay | Admitting: Internal Medicine

## 2023-02-02 DIAGNOSIS — L089 Local infection of the skin and subcutaneous tissue, unspecified: Secondary | ICD-10-CM | POA: Diagnosis not present

## 2023-02-02 DIAGNOSIS — L723 Sebaceous cyst: Secondary | ICD-10-CM | POA: Diagnosis not present

## 2023-02-02 NOTE — Telephone Encounter (Signed)
Patient called asking why his pharmacy was filling a BP medication, said he isnt on one. I explained that he has been on metoprolol and we discussed that he takes it for chest pain and his aneurysm. He will pick it up tomorrow Advised that I will call him next week on update of cholesterol medication.

## 2023-02-04 NOTE — Telephone Encounter (Signed)
Submitted additonal info for appeals for nexlizet

## 2023-02-22 ENCOUNTER — Other Ambulatory Visit (HOSPITAL_COMMUNITY): Payer: Self-pay

## 2023-02-22 MED ORDER — NEXLIZET 180-10 MG PO TABS: 1.0000 | ORAL_TABLET | Freq: Every day | ORAL | 3 refills | Status: DC

## 2023-02-22 NOTE — Telephone Encounter (Signed)
Patient made aware of approval of Nexlizet. Advised to stop zetia. Labs scheduled for 9/23

## 2023-02-22 NOTE — Telephone Encounter (Signed)
Test claim is going through now with a $0 copay for 90 days

## 2023-02-22 NOTE — Addendum Note (Signed)
Addended by: Malena Peer D on: 02/22/2023 03:41 PM   Modules accepted: Orders

## 2023-03-30 DIAGNOSIS — H60509 Unspecified acute noninfective otitis externa, unspecified ear: Secondary | ICD-10-CM | POA: Diagnosis not present

## 2023-04-16 ENCOUNTER — Ambulatory Visit (INDEPENDENT_AMBULATORY_CARE_PROVIDER_SITE_OTHER): Payer: 59 | Admitting: Family Medicine

## 2023-04-16 ENCOUNTER — Encounter: Payer: Self-pay | Admitting: Family Medicine

## 2023-04-16 VITALS — BP 110/70 | HR 62 | Temp 98.5°F | Resp 16 | Ht 75.0 in | Wt 184.4 lb

## 2023-04-16 DIAGNOSIS — H60501 Unspecified acute noninfective otitis externa, right ear: Secondary | ICD-10-CM

## 2023-04-16 DIAGNOSIS — E785 Hyperlipidemia, unspecified: Secondary | ICD-10-CM | POA: Diagnosis not present

## 2023-04-16 DIAGNOSIS — Z0000 Encounter for general adult medical examination without abnormal findings: Secondary | ICD-10-CM

## 2023-04-16 DIAGNOSIS — Z125 Encounter for screening for malignant neoplasm of prostate: Secondary | ICD-10-CM

## 2023-04-16 DIAGNOSIS — Z23 Encounter for immunization: Secondary | ICD-10-CM

## 2023-04-16 DIAGNOSIS — I700 Atherosclerosis of aorta: Secondary | ICD-10-CM

## 2023-04-16 DIAGNOSIS — D573 Sickle-cell trait: Secondary | ICD-10-CM | POA: Insufficient documentation

## 2023-04-16 LAB — PSA: PSA: 0.59 ng/mL (ref 0.10–4.00)

## 2023-04-16 MED ORDER — CIPROFLOXACIN-DEXAMETHASONE 0.3-0.1 % OT SUSP
4.0000 [drp] | Freq: Two times a day (BID) | OTIC | 0 refills | Status: DC
Start: 2023-04-16 — End: 2024-02-02

## 2023-04-16 NOTE — Assessment & Plan Note (Signed)
Last BMP and cbc in 06/2021 in normal range.

## 2023-04-16 NOTE — Assessment & Plan Note (Signed)
Last LDL 73. Currently on Bempedoic Acid-Ezetimibe 180-10 mg daily and Atorvastatin 40 mg daily. Has lab appt next week. Follows with cardiologist.

## 2023-04-16 NOTE — Assessment & Plan Note (Addendum)
We discussed the importance of regular physical activity and healthy diet for prevention of chronic illness and/or complications. Preventive guidelines reviewed. He would like PSA done today. Vaccination up to date. Next CPE in a year.

## 2023-04-16 NOTE — Patient Instructions (Addendum)
A few things to remember from today's visit:  Routine general medical examination at a health care facility  Aortic atherosclerosis (HCC)  Hyperlipidemia, unspecified hyperlipidemia type  Prostate cancer screening - Plan: PSA  Acute otitis externa of right ear, unspecified type - Plan: ciprofloxacin-dexamethasone (CIPRODEX) OTIC suspension  Try to re-schedule appt with eye care provider. Today we are doing a prostate check. I will see you back in a year, before if needed. No qtips. Debrox in left ear may help with wax.  Do not use My Chart to request refills or for acute issues that need immediate attention. If you send a my chart message, it may take a few days to be addressed, specially if I am not in the office.  Please be sure medication list is accurate. If a new problem present, please set up appointment sooner than planned today.

## 2023-04-16 NOTE — Progress Notes (Signed)
HPI: [name] is a 72 y.o.male with a PMHx significant for  CAD, HLD, aortic atherosclerosis, right-sided Bell's palsy, and OA here today for his routine physical examination.   He lives by himself, and does not drive.  Last CPE: 04/13/2022 Since his last visit he has followed with cardiologist for CAD and HLD. He has labs scheduled for next week.   Exercise: He reports he walks and stretches daily.  Diet: He is cooking at home. He mainly eats chicken. He eats vegetables, but not every day.  He tries to avoid fried foods.   Sleep: He states he is averaging about 6 hours/night  Smoking history: never Alcohol usage: He drinks 3 beers about 2x a week.   Vision: His last appointment was last year. He had an appointment in [place], missed it because he had no transportation. He has not re-scheduled. Dental: UTD on routine dental care  Immunization History  Administered Date(s) Administered   Fluad Quad(high Dose 65+) 04/28/2019, 06/14/2020, 04/13/2022   Fluad Trivalent(High Dose 65+) 04/16/2023   Influenza-Unspecified 06/26/2018, 05/01/2021   PFIZER(Purple Top)SARS-COV-2 Vaccination 08/18/2019, 09/08/2019, 12/06/2019, 05/11/2020   Pneumococcal Conjugate-13 09/05/2018   Pneumococcal Polysaccharide-23 09/28/2019   Tdap 06/22/2017   Zoster Recombinant(Shingrix) 02/08/2021, 05/01/2021    Health Maintenance  Topic Date Due   COVID-19 Vaccine (5 - 2023-24 season) 05/02/2023 (Originally 03/28/2023)   Medicare Annual Wellness (AWV)  01/12/2024   DTaP/Tdap/Td (2 - Td or Tdap) 06/23/2027   Pneumonia Vaccine 18+ Years old  Completed   INFLUENZA VACCINE  Completed   Hepatitis C Screening  Completed   Zoster Vaccines- Shingrix  Completed   HPV VACCINES  Aged Out   Colonoscopy  Discontinued   Last prostate ca screening: 2021 He reports he gets up about 1x/night to use the bathroom.  He denies any urine changes.    HLD: Associated CAD and aortic atherosclerosis. He is  taking Bempedoic Acid-Ezetimibe 180-10 mg daily and Atorvastatin 40 mg daily.  Lab Results  Component Value Date   CHOL 141 01/21/2023   HDL 53 01/21/2023   LDLCALC 73 01/21/2023   TRIG 76 01/21/2023   CHOLHDL 2.7 01/21/2023   Follows with cardiologist annually and currently on metoprolol succinate 25 mg daily and aspirin 81 mg.  Has an appt next week.  Hx of sickle cell disease. Lab Results  Component Value Date   NA 141 07/23/2021   CL 103 07/23/2021   K 3.5 07/23/2021   CO2 28 07/23/2021   BUN 9 07/23/2021   CREATININE 1.06 07/23/2021   GFR 71.91 07/23/2021   CALCIUM 9.9 07/23/2021   ALBUMIN 4.6 01/21/2023   GLUCOSE 90 07/23/2021   Lab Results  Component Value Date   WBC 5.3 07/23/2021   HGB 14.5 07/23/2021   HCT 44.4 07/23/2021   MCV 88.1 07/23/2021   PLT 211.0 07/23/2021   He states he recently noticed an odor from his right ear. He put a cutip in his ear and when he removed it had blood on it and mild headache. He went to acute care for this problem, last Wednesday, [facility].  He says he was prescribed ear drops but has not received Rx yet.  He denies ear drainage or itching.  No hearing changes. No recent URI, has some headache when symptoms started and has resolved.  Review of Systems  Constitutional:  Negative for activity change, appetite change and fever.  HENT:  Positive for ear pain. Negative for mouth sores, nosebleeds,  sore throat and trouble swallowing.   Eyes:  Negative for redness and visual disturbance.  Respiratory:  Negative for cough, shortness of breath and wheezing.   Cardiovascular:  Negative for chest pain, palpitations and leg swelling.  Gastrointestinal:  Negative for abdominal pain, blood in stool, nausea and vomiting.  Endocrine: Negative for cold intolerance, heat intolerance, polydipsia, polyphagia and polyuria.  Genitourinary:  Negative for decreased urine volume, dysuria, genital sores, hematuria and testicular pain.   Musculoskeletal:  Positive for arthralgias. Negative for gait problem and myalgias.  Skin:  Negative for color change and rash.  Allergic/Immunologic: Negative for environmental allergies.  Neurological:  Negative for syncope, weakness and headaches.  Psychiatric/Behavioral:  Negative for confusion and hallucinations.   All other systems reviewed and are negative.  Current Outpatient Medications on File Prior to Visit  Medication Sig Dispense Refill   artificial tears (LACRILUBE) OINT ophthalmic ointment Place into both eyes at bedtime. 5 g 2   aspirin 81 MG tablet Take 81 mg by mouth daily.     atorvastatin (LIPITOR) 40 MG tablet TAKE 1 TABLET(40 MG) BY MOUTH DAILY 90 tablet 3   Bempedoic Acid-Ezetimibe (NEXLIZET) 180-10 MG TABS Take 1 tablet by mouth daily. STOP ezetimibe 90 tablet 3   calcipotriene (DOVONOX) 0.005 % cream APP TOPICALLY TO GROIN AND BUTTOCKS BID     ibuprofen (ADVIL) 200 MG tablet Take 200 mg by mouth every 6 (six) hours as needed.     metoprolol succinate (TOPROL-XL) 25 MG 24 hr tablet TAKE 1 TABLET(25 MG) BY MOUTH AT BEDTIME 90 tablet 3   No current facility-administered medications on file prior to visit.   Past Medical History:  Diagnosis Date   Arthritis    Bell's palsy 2016   Right sided.  Not clear when acutely suffered this.  Was noted on exam after years of no medical attention 07/2014.  Evaluated by [facility]  subsequently.   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   Weight loss 02/12/2017   Past Surgical History:  Procedure Laterality Date   COLONOSCOPY  10/05/2014   db 3 TAs   COLONOSCOPY  01/25/2020   hand surgery     thumb   POLYPECTOMY     TONSILLECTOMY      No Known Allergies  Family History  Problem Relation Age of Onset   Sickle cell anemia Mother    Kidney disease Father    Alcohol abuse Father    Colon cancer Neg Hx    Esophageal  cancer Neg Hx    Stomach cancer Neg Hx    Rectal cancer Neg Hx    Colon polyps Neg Hx     Social History   Socioeconomic History   Marital status: Single    Spouse name: Not on file   Number of children: 0   Years of education: 12   Highest education level: 12th grade  Occupational History   Occupation: Maintenance/Housekeeping    Employer: [employer]    Comment: 2016  Tobacco Use   Smoking status: Never   Smokeless tobacco: Never  Vaping Use   Vaping status: Never Used  Substance and Sexual Activity   Alcohol use: Yes    Alcohol/week: 0.0 standard drinks of alcohol    Comment: Once a monthly   Drug use: No   Sexual activity: Yes    Partners: Female  Other Topics Concern   Not on file  Social History Narrative   Was living at a boarding house.   Patient was having difficulty affording food--reportedly because he lived in boarding house and did not have utilitiy bills, his EBT dollars were cut to $13 to $15 per month.   That has been changed.  He now receives adequate EBT dollars.      Now living at [employer] [facility] [employer].  This is for folks with a disability.  He has an apartment now with a kitchen.  The [facility] church near him set him up in these apartments that they support.     Margaretann Loveless [phone]      09/05/18: Pt. Still living at [facility] [address], likes it. Sometimes participates in events hosted by center there.   Lives alone, but sister local, and main source of support.   Enjoys doing own workout routine with weights and walking. Enjoys reading.      Social Determinants of Health   Financial Resource Strain: Low Risk  (01/06/2022)   Overall Financial Resource Strain (CARDIA)    Difficulty of Paying Living Expenses: Not hard at all  Food Insecurity: No Food Insecurity (01/06/2022)   Hunger Vital Sign    Worried About Running Out of Food in the Last Year: Never true    Ran Out of Food in the Last Year: Never true  Transportation Needs: No Transportation  Needs (01/06/2022)   PRAPARE - Administrator, Civil Service (Medical): No    Lack of Transportation (Non-Medical): No  Physical Activity: Sufficiently Active (01/06/2022)   Exercise Vital Sign    Days of Exercise per Week: 7 days    Minutes of Exercise per Session: 30 min  Stress: No Stress Concern Present (01/06/2022)   [employer] of Occupational Health - Occupational Stress Questionnaire    Feeling of Stress : Not at all  Social Connections: Socially Isolated (11/05/2020)   Social Connection and Isolation Panel [NHANES]    Frequency of Communication with Friends and Family: Once a week    Frequency of Social Gatherings with Friends and Family: Once a week    Attends Religious Services: Never    Database administrator or Organizations: No    Attends Banker Meetings: Never    Marital Status: Never married   Vitals:   04/16/23 0839  BP: 110/70  Pulse: 62  Resp: 16  Temp: 98.5 F (36.9 C)  SpO2: 97%   Body mass index is 23.05 kg/m.  Wt Readings from Last 3 Encounters:  04/16/23 184 lb 6 oz (83.6 kg)  12/15/22 186 lb (84.4 kg)  06/12/22 193 lb 6 oz (87.7 kg)   Physical Exam Vitals and nursing note reviewed.  Constitutional:      General: He is not in acute distress.    Appearance: He is well-developed.  HENT:     Head: Normocephalic and atraumatic.     Right Ear: External ear normal. Drainage (clear) and swelling present. No foreign body. Tympanic membrane is not erythematous.     Left Ear: External ear normal.     Ears:     Comments: Excessive cerumen in the left ear.    Mouth/Throat:     Mouth: Mucous membranes are moist.     Pharynx: Oropharynx is clear.  Eyes:     Conjunctiva/sclera: Conjunctivae normal.     Comments: Palpebral ptosis in the left eye.  Cardiovascular:     Rate and Rhythm: Normal rate  and regular rhythm.     Pulses:          Dorsalis pedis pulses are 2+ on the right side and 2+ on the left side.     Heart sounds:  No murmur heard. Pulmonary:     Effort: Pulmonary effort is normal. No respiratory distress.     Breath sounds: Normal breath sounds.  Abdominal:     Palpations: Abdomen is soft. There is no hepatomegaly or mass.     Tenderness: There is no abdominal tenderness.  Genitourinary:    Comments: No concerns. Musculoskeletal:     Comments: No sign of synovitis.  Lymphadenopathy:     Cervical: No cervical adenopathy.  Skin:    General: Skin is warm.     Findings: No erythema or rash.  Neurological:     General: No focal deficit present.     Mental Status: He is alert and oriented to person, place, and time.     Sensory: No sensory deficit.     Deep Tendon Reflexes:     Reflex Scores:      Bicep reflexes are 2+ on the right side and 2+ on the left side.      Patellar reflexes are 2+ on the right side and 2+ on the left side.    Comments: Right-sided facial palsy.  No other focal deficit appreciated. Otherwise stable gait, not assisted. + Stutter.  Psychiatric:        Mood and Affect: Mood and affect normal.   ASSESSMENT AND PLAN:  Mr. Beardslee was seen today for his routine physical exam.   Orders Placed This Encounter  Procedures   Flu Vaccine Trivalent High Dose (Fluad)   PSA   Lab Results  Component Value Date        PSA 0.59 04/16/2023   Routine general medical examination at a health care facility Assessment & Plan: We discussed the importance of regular physical activity and healthy diet for prevention of chronic illness and/or complications. Preventive guidelines reviewed. Vaccination up to date. Recommend re-scheduling eye exam. Fall precautions. Next CPE in a year.  Hyperlipidemia, unspecified hyperlipidemia type Assessment & Plan: Last LDL 73. Currently on Bempedoic Acid-Ezetimibe 180-10 mg daily and Atorvastatin 40 mg daily. Has lab appt next week. Follows with cardiologist.  Prostate cancer screening -     PSA; Future  Acute otitis externa of right  ear, unspecified type Recommend ciprodex in right ear bid x 7-10 days. Instructed about warning signs. Avoid Qtips use. F/U as needed.  -     Ciprofloxacin-dexAMETHasone; Place 4 drops into the right ear 2 (two) times daily.  Dispense: 7.5 mL; Refill: 0  Need for influenza vaccination -     Flu Vaccine Trivalent High Dose (Fluad)  Sickle cell trait (HCC) Assessment & Plan: Last BMP and cbc in 06/2021 in normal range.  Return in 1 year (on 04/15/2024) for CPE.  I, Rolla Etienne Wierda, acting as a scribe for Madell Heino [place], MD., have documented all relevant documentation on the behalf of Stephaun Million [place], MD, as directed by  Jacques Fife [place], MD while in the presence of Marshawn Normoyle [place], MD.   I, Wendy Mikles [place], MD, have reviewed all documentation for this visit. The documentation on 04/16/23 for the exam, diagnosis, procedures, and orders are all accurate and complete.  Tichina Koebel G. [place], MD  [facility]. [place] office.

## 2023-04-19 ENCOUNTER — Ambulatory Visit: Payer: 59 | Attending: Cardiology

## 2023-04-19 DIAGNOSIS — E785 Hyperlipidemia, unspecified: Secondary | ICD-10-CM

## 2023-04-19 LAB — COMPREHENSIVE METABOLIC PANEL
ALT: 14 IU/L (ref 0–44)
AST: 21 IU/L (ref 0–40)
Albumin: 4.4 g/dL (ref 3.9–4.9)
Alkaline Phosphatase: 60 IU/L (ref 44–121)
BUN/Creatinine Ratio: 16 (ref 10–24)
BUN: 19 mg/dL (ref 8–27)
Bilirubin Total: 0.5 mg/dL (ref 0.0–1.2)
CO2: 27 mmol/L (ref 20–29)
Calcium: 9.8 mg/dL (ref 8.6–10.2)
Chloride: 105 mmol/L (ref 96–106)
Creatinine, Ser: 1.16 mg/dL (ref 0.76–1.27)
Globulin, Total: 2.3 g/dL (ref 1.5–4.5)
Glucose: 80 mg/dL (ref 70–99)
Potassium: 4.3 mmol/L (ref 3.5–5.2)
Sodium: 142 mmol/L (ref 134–144)
Total Protein: 6.7 g/dL (ref 6.0–8.5)
eGFR: 68 mL/min/{1.73_m2} (ref >59)

## 2023-04-19 LAB — LIPID PANEL
Chol/HDL Ratio: 2.9 ratio (ref 0.0–5.0)
Cholesterol, Total: 126 mg/dL (ref 100–199)
HDL: 44 mg/dL (ref >39)
LDL Chol Calc (NIH): 69 mg/dL (ref 0–99)
Triglycerides: 58 mg/dL (ref 0–149)
VLDL Cholesterol Cal: 13 mg/dL (ref 5–40)

## 2023-04-19 LAB — URIC ACID: Uric Acid: 8.4 mg/dL (ref 3.8–8.4)

## 2023-06-09 ENCOUNTER — Ambulatory Visit: Payer: 59 | Admitting: Orthopedic Surgery

## 2023-06-09 DIAGNOSIS — M170 Bilateral primary osteoarthritis of knee: Secondary | ICD-10-CM

## 2023-06-10 NOTE — Progress Notes (Signed)
Office Visit Note   Patient: Miguel Peters           Date of Birth: [birth year]           MRN: [id number] Visit Date: 06/09/2023 Requested by: [place], Betty G, MD [address] [place],  [place] [address] PCP: [place], Betty G, MD  Subjective: Chief Complaint  Patient presents with   Left Knee - Pain    HPI: Miguel Peters is a 72 y.o. male who presents to the office reporting left knee pain.  Decision today was for or against repeat aspiration and injection.  Last month his knee was very painful but it started feeling better about 2 weeks ago.  He does walk about 1 mile a day.  Last seen December of last year.  No interval history of injury or trauma..                ROS: All systems reviewed are negative as they relate to the chief complaint within the history of present illness.  Patient denies fevers or chills.  Assessment & Plan: Visit Diagnoses:  1. Primary osteoarthritis of both knees     Plan: Impression is left knee pain with arthritis.  Repeat aspiration and injection we will delay today.  If his symptoms recur or if he develops mechanical symptoms or effusion he should come back in and we can reevaluate.  Otherwise continue with nonweightbearing quad strengthening exercises and ambulation as tolerated.  Would not advise prolonged walking for exercise.  Follow-Up Instructions: No follow-ups on file.   Orders:  No orders of the defined types were placed in this encounter.  No orders of the defined types were placed in this encounter.     Procedures: No procedures performed   Clinical Data: No additional findings.  Objective: Vital Signs: There were no vitals taken for this visit.  Physical Exam:  Constitutional: Patient appears well-developed HEENT:  Head: Normocephalic Eyes:EOM are normal Neck: Normal range of motion Cardiovascular: Normal rate Pulmonary/chest: Effort normal Neurologic: Patient is alert Skin: Skin is warm Psychiatric:  Patient has normal mood and affect  Ortho Exam: Ortho exam demonstrates no effusion.  Extensor mechanism intact.  Range of motion nearly full.  Collateral cruciate ligaments are stable.  Pedal pulses palpable.  No groin pain with internal/external rotation of the leg.  Specialty Comments:  No specialty comments available.  Imaging: No results found.   PMFS History: Patient Active Problem List   Diagnosis Date Noted   Sickle cell trait (HCC) 04/16/2023   Routine general medical examination at a health care facility 04/16/2023   Primary osteoarthritis of both knees 06/12/2022   Right epiphora 06/12/2022   Aortic atherosclerosis (HCC) 07/25/2021   Chronic pruritic rash in adult 12/28/2017   Hyperlipidemia 08/30/2017   Chronic lower back pain 08/30/2017   Stutter 01/08/2017   Right-sided Bell's palsy 09/11/2014   Bell's palsy 07/27/2014   Past Medical History:  Diagnosis Date   Arthritis    Bell's palsy 2016   Right sided.  Not clear when acutely suffered this.  Was noted on exam after years of no medical attention 07/2014.  Evaluated by [facility]  subsequently.   Hyperlipidemia    Sickle cell trait (HCC)    Stutter age 77   cannot recall if something happened in life that set this off.   Weight loss 02/12/2017    Family History  Problem Relation Age of Onset   Sickle cell anemia Mother  Kidney disease Father    Alcohol abuse Father    Colon cancer Neg Hx    Esophageal cancer Neg Hx    Stomach cancer Neg Hx    Rectal cancer Neg Hx    Colon polyps Neg Hx     Past Surgical History:  Procedure Laterality Date   COLONOSCOPY  10/05/2014   db 3 TAs   COLONOSCOPY  01/25/2020   hand surgery     thumb   POLYPECTOMY     TONSILLECTOMY     Social History   Occupational History   Occupation: Maintenance/Housekeeping    Employer: [employer]    Comment: 2016  Tobacco Use   Smoking status: Never   Smokeless tobacco: Never  Vaping Use   Vaping  status: Never Used  Substance and Sexual Activity   Alcohol use: Yes    Alcohol/week: 0.0 standard drinks of alcohol    Comment: Once a monthly   Drug use: No   Sexual activity: Yes    Partners: Female

## 2023-06-13 ENCOUNTER — Encounter: Payer: Self-pay | Admitting: Orthopedic Surgery

## 2023-07-16 ENCOUNTER — Ambulatory Visit: Payer: 59 | Admitting: Surgical

## 2023-07-29 ENCOUNTER — Ambulatory Visit (INDEPENDENT_AMBULATORY_CARE_PROVIDER_SITE_OTHER): Payer: 59 | Admitting: Surgical

## 2023-07-29 ENCOUNTER — Encounter: Payer: Self-pay | Admitting: Surgical

## 2023-07-29 DIAGNOSIS — M170 Bilateral primary osteoarthritis of knee: Secondary | ICD-10-CM | POA: Diagnosis not present

## 2023-07-30 ENCOUNTER — Encounter: Payer: Self-pay | Admitting: Surgical

## 2023-07-30 NOTE — Progress Notes (Signed)
 Office Visit Note   Patient: Miguel Peters            Date of Birth: 03-19-1951           MRN: [id number] Visit Date: 07/29/2023 Requested by: Jordan, Betty G, MD [address] [place],  [place] [address] PCP: Jordan, Betty G, MD  Subjective: Chief Complaint  Patient presents with   Left Knee - Pain    HPI: Miguel Peters  is a 73 y.o. male who presents to the office reporting left knee pain.  Patient has history of left knee osteoarthritis.  He returns today after seeing Dr. Addie in late 2024 with complaint of increased left knee pain.  States that he has been doing a lot more walking and the holiday season and has noticed increased pain and swelling in the knee.  No specific event or injury responsible for his increase in pain.  No new groin pain or radiating pain.  Primarily just localizes pain to the medial aspect of the knee.  No new mechanical symptoms.  Taking Tylenol for pain with mild relief..                ROS: All systems reviewed are negative as they relate to the chief complaint within the history of present illness.  Patient denies fevers or chills.  Assessment & Plan: Visit Diagnoses:  1. Primary osteoarthritis of both knees     Plan: Patient is a 73 year old male who presents for evaluation of left knee pain.  Has history of end-stage left knee arthritis.  He states that pain has been flared up in the last several weeks as he has been doing more walking.  We discussed options available to patient such as waiting it out to see if the pain will improve with activity modification versus trying topical medications versus aspiration/injection.  Not currently bothering him enough to justify injection so he will try Voltaren gel and follow-up if this does not provide any significant relief over the next several weeks.  Patient agreed with plan.  All questions answered.  Follow-Up Instructions: No follow-ups on file.   Orders:  No orders of the defined types were  placed in this encounter.  No orders of the defined types were placed in this encounter.     Procedures: No procedures performed   Clinical Data: No additional findings.  Objective: Vital Signs: There were no vitals taken for this visit.  Physical Exam:  Constitutional: Patient appears well-developed HEENT:  Head: Normocephalic Eyes:EOM are normal Neck: Normal range of motion Cardiovascular: Normal rate Pulmonary/chest: Effort normal Neurologic: Patient is alert Skin: Skin is warm Psychiatric: Patient has normal mood and affect  Ortho Exam: Ortho exam demonstrates left knee with about 5 degrees extension and 110 degrees of knee flexion.  Moderate knee effusion is noted on exam.  No cellulitis or skin changes noted.  No significant pain with passive motion of the knee.  Does have good quad and hamstring strength rated 5/5.  No calf tenderness.  Negative Homans' sign.  Stable to anterior posterior drawer sign.  No pain with hip range of motion.  Specialty Comments:  No specialty comments available.  Imaging: No results found.   PMFS History: Patient Active Problem List   Diagnosis Date Noted   Sickle cell trait (HCC) 04/16/2023   Routine general medical examination at a health care facility 04/16/2023   Primary osteoarthritis of both knees 06/12/2022   Right epiphora 06/12/2022   Aortic atherosclerosis (HCC) 07/25/2021  Chronic pruritic rash in adult 12/28/2017   Hyperlipidemia 08/30/2017   Chronic lower back pain 08/30/2017   Stutter 01/08/2017   Right-sided Bell's palsy 09/11/2014   Bell's palsy 07/27/2014   Past Medical History:  Diagnosis Date   Arthritis    Bell's palsy 2016   Right sided.  Not clear when acutely suffered this.  Was noted on exam after years of no medical attention 07/2014.  Evaluated by [facility]  subsequently.   Hyperlipidemia    Sickle cell trait (HCC)    Stutter age 88   cannot recall if something happened in life that  set this off.   Weight loss 02/12/2017    Family History  Problem Relation Age of Onset   Sickle cell anemia Mother    Kidney disease Father    Alcohol abuse Father    Colon cancer Neg Hx    Esophageal cancer Neg Hx    Stomach cancer Neg Hx    Rectal cancer Neg Hx    Colon polyps Neg Hx     Past Surgical History:  Procedure Laterality Date   COLONOSCOPY  10/05/2014   db 3 TAs   COLONOSCOPY  01/25/2020   hand surgery     thumb   POLYPECTOMY     TONSILLECTOMY     Social History   Occupational History   Occupation: Maintenance/Housekeeping    Employer: [employer]    Comment: 2016  Tobacco Use   Smoking status: Never   Smokeless tobacco: Never  Vaping Use   Vaping status: Never Used  Substance and Sexual Activity   Alcohol use: Yes    Alcohol/week: 0.0 standard drinks of alcohol    Comment: Once a monthly   Drug use: No   Sexual activity: Yes    Partners: Female

## 2023-09-02 DIAGNOSIS — H02716 Chloasma of left eye, unspecified eyelid and periocular area: Secondary | ICD-10-CM | POA: Diagnosis not present

## 2023-09-02 DIAGNOSIS — H02713 Chloasma of right eye, unspecified eyelid and periocular area: Secondary | ICD-10-CM | POA: Diagnosis not present

## 2023-09-10 NOTE — Progress Notes (Signed)
 ACUTE VISIT Chief Complaint  Patient presents with   spot around eye    Brown spot around his right eye    HPI: Miguel Peters is a 73 y.o. male with a PMHx significant for CAD, HLD, aortic atherosclerosis, right-sided Bell's palsy, and OA, who is here today complaining of pigmentation change around his right eye.   States that he saw his dermatologist 2 weeks ago for this problem, brown area around his right eye. He is not certain about Dx but prior visit in 09/2022, he was dx'ed with postinflammatory hyperpigmentation. He believes it is worsening.  He was instructed to follow in 11/2023 if it continues to worsen.  He also has some similar areas near his left eye and on his neck.  Denies any pain or itching.  He is not applying any treatment.  Review of Systems  Constitutional:  Negative for activity change, appetite change, chills and fever.  HENT:  Negative for mouth sores and sore throat.   Gastrointestinal:  Negative for abdominal pain, nausea and vomiting.  Neurological:  Negative for weakness, numbness and headaches.  Psychiatric/Behavioral:  Negative for confusion and hallucinations.   See other pertinent positives and negatives in HPI.  Current Outpatient Medications on File Prior to Visit  Medication Sig Dispense Refill   artificial tears (LACRILUBE) OINT ophthalmic ointment Place into both eyes at bedtime. 5 g 2   aspirin 81 MG tablet Take 81 mg by mouth daily.     atorvastatin (LIPITOR) 40 MG tablet TAKE 1 TABLET(40 MG) BY MOUTH DAILY 90 tablet 3   Bempedoic Acid-Ezetimibe (NEXLIZET) 180-10 MG TABS Take 1 tablet by mouth daily. STOP ezetimibe 90 tablet 3   calcipotriene (DOVONOX) 0.005 % cream APP TOPICALLY TO GROIN AND BUTTOCKS BID     ciprofloxacin-dexamethasone (CIPRODEX) OTIC suspension Place 4 drops into the right ear 2 (two) times daily. 7.5 mL 0   ibuprofen (ADVIL) 200 MG tablet Take 200 mg by mouth every 6 (six) hours as needed.     metoprolol succinate  (TOPROL-XL) 25 MG 24 hr tablet TAKE 1 TABLET(25 MG) BY MOUTH AT BEDTIME 90 tablet 3   No current facility-administered medications on file prior to visit.   Past Medical History:  Diagnosis Date   Arthritis    Bell's palsy 2016   Right sided.  Not clear when acutely suffered this.  Was noted on exam after years of no medical attention 07/2014.  Evaluated by [facility]  subsequently.   Hyperlipidemia    Sickle cell trait (HCC)    Stutter age 47   cannot recall if something happened in life that set this off.   Weight loss 02/12/2017   No Known Allergies  Social History   Socioeconomic History   Marital status: Single    Spouse name: Not on file   Number of children: 0   Years of education: 12   Highest education level: 12th grade  Occupational History   Occupation: Maintenance/Housekeeping    Employer: [employer]    Comment: 2016  Tobacco Use   Smoking status: Never   Smokeless tobacco: Never  Vaping Use   Vaping status: Never Used  Substance and Sexual Activity   Alcohol use: Yes    Alcohol/week: 0.0 standard drinks of alcohol    Comment: Once a monthly   Drug use: No   Sexual activity: Yes    Partners: Female  Other Topics Concern   Not on file  Social History Narrative  Was living at a boarding house.   Patient was having difficulty affording food--reportedly because he lived in boarding house and did not have utilitiy bills, his EBT dollars were cut to $13 to $15 per month.   That has been changed.  He now receives adequate EBT dollars.      Now living at [facility] [employer] [facility] [employer].  This is for folks with a disability.  He has an apartment now with a kitchen.  The [facility] near him set him up in these apartments that they support.     Margaretann Loveless [phone]      09/05/18: Pt. Still living at [facility] [address], likes it. Sometimes participates in events hosted by center there.   Lives alone, but sister local, and main source of support.   Enjoys doing  own workout routine with weights and walking. Enjoys reading.      Social Drivers of Corporate investment banker Strain: Low Risk  (01/06/2022)   Overall Financial Resource Strain (CARDIA)    Difficulty of Paying Living Expenses: Not hard at all  Food Insecurity: No Food Insecurity (01/06/2022)   Hunger Vital Sign    Worried About Running Out of Food in the Last Year: Never true    Ran Out of Food in the Last Year: Never true  Transportation Needs: No Transportation Needs (01/06/2022)   PRAPARE - Administrator, Civil Service (Medical): No    Lack of Transportation (Non-Medical): No  Physical Activity: Sufficiently Active (01/06/2022)   Exercise Vital Sign    Days of Exercise per Week: 7 days    Minutes of Exercise per Session: 30 min  Stress: No Stress Concern Present (01/06/2022)   [employer] of Occupational Health - Occupational Stress Questionnaire    Feeling of Stress : Not at all  Social Connections: Socially Isolated (11/05/2020)   Social Connection and Isolation Panel [NHANES]    Frequency of Communication with Friends and Family: Once a week    Frequency of Social Gatherings with Friends and Family: Once a week    Attends Religious Services: Never    Database administrator or Organizations: No    Attends Banker Meetings: Never    Marital Status: Never married    Vitals:   09/13/23 0753  BP: 120/70  Pulse: 79  Resp: 16  Temp: 98.6 F (37 C)  SpO2: 98%   Body mass index is 23.76 kg/m.  Physical Exam Vitals and nursing note reviewed.  Constitutional:      General: He is not in acute distress.    Appearance: He is well-developed.  HENT:     Head: Normocephalic and atraumatic.     Mouth/Throat:     Mouth: Mucous membranes are moist.     Pharynx: Oropharynx is clear.  Eyes:     Conjunctiva/sclera: Conjunctivae normal.  Cardiovascular:     Rate and Rhythm: Normal rate and regular rhythm.     Heart sounds: No murmur  heard. Pulmonary:     Effort: Pulmonary effort is normal. No respiratory distress.     Breath sounds: Normal breath sounds.  Lymphadenopathy:     Cervical: No cervical adenopathy.  Skin:    General: Skin is warm.     Findings: No erythema or rash.     Comments: Hyperpigmented macular area periocular, involving upper and lower eye lid and cheek, right worse than left. Some hyperpigmentation changes on neck also. Hyperpigmented papular lesions scattered around eyes. No suspicious lesions.  Neurological:     Mental Status: He is alert and oriented to person, place, and time.     Gait: Gait normal.  Psychiatric:        Mood and Affect: Mood and affect normal.    ASSESSMENT AND PLAN:  Mr. Drumwright was seen today for a postinflammatory pigmentation change around his right eye.   Postinflammatory pigmentary changes  We discussed diagnosis, prognosis, and treatment options as well as differential Dx's. Hx and examination do not suggest a serious process. He has already seen dermatology for this problem. Recommend wearing sunscreen and avoiding needed UV exposure. Continue monitoring for changes. He has an appointment for follow-up with a dermatologist in 11/2023, Dr Rocky Link.  I spent a total of 22 minutes in both face to face and non face to face activities for this visit on the date of this encounter. During this time history was obtained and documented, examination was performed, and assessment/plan discussed.  Return if symptoms worsen or fail to improve, for keep next appointment.  I, Rolla Etienne Wierda, acting as a scribe for Dannya Pitkin [place], MD., have documented all relevant documentation on the behalf of Kealani Leckey [place], MD, as directed by  Tammela Bales [place], MD while in the presence of Shiryl Ruddy [place], MD.   I, Jackston Oaxaca [place], MD, have reviewed all documentation for this visit. The documentation on 09/13/23 for the exam, diagnosis, procedures, and orders are all accurate and complete.  Keynan Heffern G.  [place], MD  [facility]. [place] office.

## 2023-09-13 ENCOUNTER — Encounter: Payer: Self-pay | Admitting: Family Medicine

## 2023-09-13 ENCOUNTER — Ambulatory Visit (INDEPENDENT_AMBULATORY_CARE_PROVIDER_SITE_OTHER): Payer: 59 | Admitting: Family Medicine

## 2023-09-13 VITALS — BP 120/70 | HR 79 | Temp 98.6°F | Resp 16 | Ht 75.0 in | Wt 190.1 lb

## 2023-09-13 DIAGNOSIS — L819 Disorder of pigmentation, unspecified: Secondary | ICD-10-CM

## 2023-09-13 NOTE — Patient Instructions (Addendum)
 A few things to remember from today's visit:  Postinflammatory pigmentary changes  Benign, use sun screen, and avoid direct sun exposure. Keep appt with dermatologist in 11/2023.  If you need refills for medications you take chronically, please call your pharmacy. Do not use My Chart to request refills or for acute issues that need immediate attention. If you send a my chart message, it may take a few days to be addressed, specially if I am not in the office.  Please be sure medication list is accurate. If a new problem present, please set up appointment sooner than planned today.

## 2023-09-15 ENCOUNTER — Other Ambulatory Visit: Payer: Self-pay | Admitting: Nurse Practitioner

## 2023-11-01 DIAGNOSIS — L821 Other seborrheic keratosis: Secondary | ICD-10-CM | POA: Diagnosis not present

## 2023-11-10 ENCOUNTER — Telehealth: Payer: Self-pay

## 2023-11-10 DIAGNOSIS — H6123 Impacted cerumen, bilateral: Secondary | ICD-10-CM | POA: Diagnosis not present

## 2023-11-10 DIAGNOSIS — H6690 Otitis media, unspecified, unspecified ear: Secondary | ICD-10-CM | POA: Diagnosis not present

## 2023-11-10 NOTE — Telephone Encounter (Signed)
 I called and spoke with pt. Advised office is closed Friday and PCP is out on vacation through 4/22. Offered appt Wednesday at 4:30 but call was disconnected.

## 2023-11-10 NOTE — Telephone Encounter (Signed)
 Copied from CRM [id number]. Topic: General - Other >> Nov 10, 2023  9:00 AM Dorisann Garre T wrote: Reason for CRM: patient is having trouble hearing he needs to be seen on Friday there where no appts available for Friday he would like a call back to see if he can see dr [place]

## 2023-11-30 DIAGNOSIS — L821 Other seborrheic keratosis: Secondary | ICD-10-CM | POA: Diagnosis not present

## 2023-11-30 DIAGNOSIS — L819 Disorder of pigmentation, unspecified: Secondary | ICD-10-CM | POA: Diagnosis not present

## 2023-12-13 ENCOUNTER — Ambulatory Visit: Admitting: Surgical

## 2023-12-13 ENCOUNTER — Other Ambulatory Visit (INDEPENDENT_AMBULATORY_CARE_PROVIDER_SITE_OTHER): Payer: Self-pay

## 2023-12-13 ENCOUNTER — Encounter: Payer: Self-pay | Admitting: Surgical

## 2023-12-13 DIAGNOSIS — M25562 Pain in left knee: Secondary | ICD-10-CM

## 2023-12-13 DIAGNOSIS — M1712 Unilateral primary osteoarthritis, left knee: Secondary | ICD-10-CM

## 2023-12-13 DIAGNOSIS — M170 Bilateral primary osteoarthritis of knee: Secondary | ICD-10-CM

## 2023-12-13 MED ORDER — BUPIVACAINE HCL 0.25 % IJ SOLN
4.0000 mL | INTRAMUSCULAR | Status: AC | PRN
Start: 2023-12-13 — End: 2023-12-13
  Administered 2023-12-13: 4 mL via INTRA_ARTICULAR

## 2023-12-13 MED ORDER — LIDOCAINE HCL 1 % IJ SOLN
5.0000 mL | INTRAMUSCULAR | Status: AC | PRN
Start: 2023-12-13 — End: 2023-12-13
  Administered 2023-12-13: 5 mL

## 2023-12-13 MED ORDER — TRIAMCINOLONE ACETONIDE 40 MG/ML IJ SUSP
40.0000 mg | INTRAMUSCULAR | Status: AC | PRN
Start: 2023-12-13 — End: 2023-12-13
  Administered 2023-12-13: 40 mg via INTRA_ARTICULAR

## 2023-12-13 NOTE — Progress Notes (Signed)
 Office Visit Note   Patient: Miguel Peters            Date of Birth: [birth year]           MRN: [id number] Visit Date: 12/13/2023 Requested by: [place], Betty G, MD [address] [place],  [place] [address] PCP: [place], Betty G, MD  Subjective: Chief Complaint  Patient presents with   Left Knee - Pain    HPI: Miguel Peters  is a 73 y.o. male who presents to the office reporting left knee pain.  Patient states he was bending down exercising about a week ago and felt increased medial left knee pain.  Has had some associated swelling.  No bruising.  No pain elsewhere in the knee or leg.  Taking Tylenol with some relief.  It is about 30 to 40% better compared with the initial onset 1 week ago.  Occasionally wakes with pain.  Has not caused him to limp.  Has been able to weight-bear on the leg.  Still continues to do some exercising with weight lifting for his arms and legs..                ROS: All systems reviewed are negative as they relate to the chief complaint within the history of present illness.  Patient denies fevers or chills.  Assessment & Plan: Visit Diagnoses:  1. Primary osteoarthritis of both knees   2. Acute pain of left knee     Plan: Patient is a 73 year old male who presents for evaluation of left knee pain.  Has increased left knee pain over the last week with radiographs taken today demonstrating his known history of severe medial compartment arthritis but no acute osseous abnormality.  After discussion of options, he would like to try cortisone injection.  Has tolerated this well in the past.  15 cc of nonpurulent synovial fluid was aspirated from the left knee and cortisone injection administered.  Follow-up with the office as needed if pain does not improve.  Follow-Up Instructions: No follow-ups on file.   Orders:  Orders Placed This Encounter  Procedures   XR KNEE 3 VIEW LEFT   No orders of the defined types were placed in this encounter.      Procedures: Large Joint Inj: L knee on 12/13/2023 3:49 PM Indications: diagnostic evaluation, joint swelling and pain Details: 18 G 1.5 in needle, superolateral approach  Arthrogram: No  Medications: 5 mL lidocaine  1 %; 4 mL bupivacaine  0.25 %; 40 mg triamcinolone  acetonide 40 MG/ML Aspirate: 15 mL Outcome: tolerated well, no immediate complications Procedure, treatment alternatives, risks and benefits explained, specific risks discussed. Consent was given by the patient. Immediately prior to procedure a time out was called to verify the correct patient, procedure, equipment, support staff and site/side marked as required. Patient was prepped and draped in the usual sterile fashion.       Clinical Data: No additional findings.  Objective: Vital Signs: There were no vitals taken for this visit.  Physical Exam:  Constitutional: Patient appears well-developed HEENT:  Head: Normocephalic Eyes:EOM are normal Neck: Normal range of motion Cardiovascular: Normal rate Pulmonary/chest: Effort normal Neurologic: Patient is alert Skin: Skin is warm Psychiatric: Patient has normal mood and affect  Ortho Exam: Ortho exam demonstrates left knee with 3 degrees extension 105 degrees of knee flexion.  Positive effusion.  No cellulitis or skin changes.  Stable to anterior posterior drawer sign.  Stable to varus and valgus stress at 0 and 30 degrees.  Varus alignment noted.  Tender over the medial joint line moderately.  No tenderness over the lateral joint line.  No pain with hip range of motion.  Palpable PT pulse.  Specialty Comments:  No specialty comments available.  Imaging: XR KNEE 3 VIEW LEFT Result Date: 12/13/2023 AP, lateral, sunrise views of left knee reviewed.  Severe medial compartment arthritis noted with joint space narrowing, osteophyte formation, subchondral sclerosis.  Moderate degenerative changes of patellofemoral and lateral compartments.  Varus alignment noted.  No fracture  or dislocation.  No abnormal patellar height.    PMFS History: Patient Active Problem List   Diagnosis Date Noted   Sickle cell trait (HCC) 04/16/2023   Routine general medical examination at a health care facility 04/16/2023   Primary osteoarthritis of both knees 06/12/2022   Right epiphora 06/12/2022   Aortic atherosclerosis (HCC) 07/25/2021   Chronic pruritic rash in adult 12/28/2017   Hyperlipidemia 08/30/2017   Chronic lower back pain 08/30/2017   Stutter 01/08/2017   Right-sided Bell's palsy 09/11/2014   Bell's palsy 07/27/2014   Past Medical History:  Diagnosis Date   Arthritis    Bell's palsy 2016   Right sided.  Not clear when acutely suffered this.  Was noted on exam after years of no medical attention 07/2014.  Evaluated by [facility]  subsequently.   Hyperlipidemia    Sickle cell trait (HCC)    Stutter age 72   cannot recall if something happened in life that set this off.   Weight loss 02/12/2017    Family History  Problem Relation Age of Onset   Sickle cell anemia Mother    Kidney disease Father    Alcohol abuse Father    Colon cancer Neg Hx    Esophageal cancer Neg Hx    Stomach cancer Neg Hx    Rectal cancer Neg Hx    Colon polyps Neg Hx     Past Surgical History:  Procedure Laterality Date   COLONOSCOPY  10/05/2014   db 3 TAs   COLONOSCOPY  01/25/2020   hand surgery     thumb   POLYPECTOMY     TONSILLECTOMY     Social History   Occupational History   Occupation: Maintenance/Housekeeping    Employer: [employer]    Comment: 2016  Tobacco Use   Smoking status: Never   Smokeless tobacco: Never  Vaping Use   Vaping status: Never Used  Substance and Sexual Activity   Alcohol use: Yes    Alcohol/week: 0.0 standard drinks of alcohol    Comment: Once a monthly   Drug use: No   Sexual activity: Yes    Partners: Female

## 2024-02-02 ENCOUNTER — Ambulatory Visit

## 2024-02-02 ENCOUNTER — Encounter: Payer: Self-pay | Admitting: Family Medicine

## 2024-02-02 ENCOUNTER — Ambulatory Visit (INDEPENDENT_AMBULATORY_CARE_PROVIDER_SITE_OTHER): Admitting: Family Medicine

## 2024-02-02 VITALS — BP 140/80 | HR 100 | Resp 16 | Ht 75.0 in | Wt 187.1 lb

## 2024-02-02 DIAGNOSIS — J9811 Atelectasis: Secondary | ICD-10-CM | POA: Diagnosis not present

## 2024-02-02 DIAGNOSIS — M4802 Spinal stenosis, cervical region: Secondary | ICD-10-CM | POA: Diagnosis not present

## 2024-02-02 DIAGNOSIS — R030 Elevated blood-pressure reading, without diagnosis of hypertension: Secondary | ICD-10-CM

## 2024-02-02 DIAGNOSIS — M5412 Radiculopathy, cervical region: Secondary | ICD-10-CM

## 2024-02-02 DIAGNOSIS — R200 Anesthesia of skin: Secondary | ICD-10-CM | POA: Diagnosis not present

## 2024-02-02 DIAGNOSIS — M542 Cervicalgia: Secondary | ICD-10-CM

## 2024-02-02 DIAGNOSIS — R0789 Other chest pain: Secondary | ICD-10-CM | POA: Diagnosis not present

## 2024-02-02 DIAGNOSIS — M4312 Spondylolisthesis, cervical region: Secondary | ICD-10-CM | POA: Diagnosis not present

## 2024-02-02 DIAGNOSIS — M47812 Spondylosis without myelopathy or radiculopathy, cervical region: Secondary | ICD-10-CM | POA: Diagnosis not present

## 2024-02-02 DIAGNOSIS — R918 Other nonspecific abnormal finding of lung field: Secondary | ICD-10-CM | POA: Diagnosis not present

## 2024-02-02 DIAGNOSIS — R079 Chest pain, unspecified: Secondary | ICD-10-CM | POA: Diagnosis not present

## 2024-02-02 MED ORDER — PREDNISONE 20 MG PO TABS: ORAL_TABLET | ORAL | 0 refills | Status: AC

## 2024-02-02 MED ORDER — TIZANIDINE HCL 4 MG PO TABS: 2.0000 mg | ORAL_TABLET | Freq: Every day | ORAL | 0 refills | Status: DC

## 2024-02-02 MED ORDER — TRAMADOL HCL 50 MG PO TABS
50.0000 mg | ORAL_TABLET | Freq: Two times a day (BID) | ORAL | 0 refills | Status: DC | PRN
Start: 2024-02-02 — End: 2024-02-04

## 2024-02-02 NOTE — Patient Instructions (Addendum)
 A few things to remember from today's visit:  Neck pain on left side - Plan: DG Cervical Spine Complete, traMADol  (ULTRAM ) 50 MG tablet, tiZANidine  (ZANAFLEX ) 4 MG tablet  Cervical radiculopathy - Plan: DG Cervical Spine Complete, predniSONE  (DELTASONE ) 20 MG tablet  Other chest pain - Plan: EKG 12-Lead, DG Chest 2 View  Chest pain seems to be musculoskeletal.  Monitor for new symptoms.  If persistent you will need to arrange appointment with cardiologist if any associated shortness of breath or palpitations you need to go to the ER. Prednisone  to be taken with breakfast. Muscle relaxant, Zanaflex  at bedtime for 2 weeks and then as needed. Tramadol  50 mg at bedtime. These medications can cause drowsiness so be careful with falls.  If neck and left arm pain are persistent and not resolved in 4 to 6 weeks, we will need a cervical MRI and/or referral to orthopedics.  If you need refills for medications you take chronically, please call your pharmacy. Do not use My Chart to request refills or for acute issues that need immediate attention. If you send a my chart message, it may take a few days to be addressed, specially if I am not in the office.  Please be sure medication list is accurate. If a new problem present, please set up appointment sooner than planned today.

## 2024-02-02 NOTE — Progress Notes (Unsigned)
 ACUTE VISIT Chief Complaint  Patient presents with   Arm Pain    left   Shoulder Pain    left   HPI: [name]  is a 73 y.o. male with a PMHx significant for CAD, HLD, aortic atherosclerosis, right-sided Bell's palsy, and OA, who is here today complaining of left arm/shoulder pain.  Says that his shoulder pain began Monday 7/07, which he attributes to some heavy lifting he did last week while bringing in groceries.  Describes pain as intermittent shoulder pain diffusing from trapezium/shoulder down his entire left arm w/ associated tingling sensation, and this has been interfering with sleep. Pain is constant but intensity varies. He has not noted edema or erythema. + Neck pain.  For relief he says he has taken Tylenol, Aspirin, ES-Excedrin.   Additionally, he also mentions having chest soreness that he feels is diffusing from left-side of his neck, which started today and he associates it with his left arm/shoulder pain. He can pinpoint area of pain,statrted a few hours ago while resting. No associated SOB,palpitations,diaphoresis,or dizziness. Exacerbated on palpation and with breathing, otherwise he describes this pain as mild. Denies cough, wheezing, heartburn, or LE edema.  Aortic and coronary atherosclerosis , follows with cardiologist. No hx of HTN. Today BP mildly elevated.  Currently on Metoprolol  succinate 25 mg daily and Atorvastatin  40 mg daily.  Review of Systems  Constitutional:  Negative for activity change, appetite change, fever and unexpected weight change.  HENT:  Negative for sore throat and trouble swallowing.   Cardiovascular:        (+) Chest Soreness  Gastrointestinal:  Negative for abdominal pain, nausea and vomiting.  Endocrine: Negative for cold intolerance and heat intolerance.  Genitourinary:  Negative for decreased urine volume, dysuria and hematuria.  Musculoskeletal:  Positive for neck pain (left-side, diffuse from chest). Negative  for gait problem and myalgias.       (+) Left Shoulder/Arm Pain  Skin:  Negative for color change, pallor and rash.  Neurological:  Negative for syncope, weakness and headaches.       (+) Tingling, left arm  Psychiatric/Behavioral:  Negative for confusion. The patient is nervous/anxious.   See other pertinent positives and negatives in HPI.  Current Outpatient Medications on File Prior to Visit  Medication Sig Dispense Refill   artificial tears (LACRILUBE) OINT ophthalmic ointment Place into both eyes at bedtime. 5 g 2   aspirin 81 MG tablet Take 81 mg by mouth daily.     atorvastatin  (LIPITOR) 40 MG tablet TAKE 1 TABLET(40 MG) BY MOUTH DAILY 90 tablet 3   Bempedoic Acid-Ezetimibe  (NEXLIZET ) 180-10 MG TABS Take 1 tablet by mouth daily. STOP ezetimibe  90 tablet 3   calcipotriene (DOVONOX) 0.005 % cream APP TOPICALLY TO GROIN AND BUTTOCKS BID     ibuprofen (ADVIL) 200 MG tablet Take 200 mg by mouth every 6 (six) hours as needed.     metoprolol  succinate (TOPROL -XL) 25 MG 24 hr tablet TAKE 1 TABLET(25 MG) BY MOUTH AT BEDTIME 90 tablet 3   No current facility-administered medications on file prior to visit.   Past Medical History:  Diagnosis Date   Arthritis    Bell's palsy 2016   Right sided.  Not clear when acutely suffered this.  Was noted on exam after years of no medical attention 07/2014.  Evaluated by [facility]  subsequently.   Hyperlipidemia    Sickle cell trait (HCC)    Stutter age 82   cannot recall if something  happened in life that set this off.   Weight loss 02/12/2017   No Known Allergies  Social History   Socioeconomic History   Marital status: Single    Spouse name: Not on file   Number of children: 0   Years of education: 12   Highest education level: 12th grade  Occupational History   Occupation: Maintenance/Housekeeping    Employer: [employer]    Comment: 2016  Tobacco Use   Smoking status: Never   Smokeless tobacco: Never  Vaping  Use   Vaping status: Never Used  Substance and Sexual Activity   Alcohol use: Yes    Alcohol/week: 0.0 standard drinks of alcohol    Comment: Once a monthly   Drug use: No   Sexual activity: Yes    Partners: Female  Other Topics Concern   Not on file  Social History Narrative   Was living at a boarding house.   Patient was having difficulty affording food--reportedly because he lived in boarding house and did not have utilitiy bills, his EBT dollars were cut to $13 to $15 per month.   That has been changed.  He now receives adequate EBT dollars.      Now living at [employer] [facility] [employer] [employer].  This is for folks with a disability.  He has an apartment now with a kitchen.  The [facility] near him set him up in these apartments that they support.     [facility] [phone]      09/05/18: Pt. Still living at [facility] [address], likes it. Sometimes participates in events hosted by center there.   Lives alone, but sister local, and main source of support.   Enjoys doing own workout routine with weights and walking. Enjoys reading.      Social Drivers of Corporate investment banker Strain: Low Risk  (01/06/2022)   Overall Financial Resource Strain (CARDIA)    Difficulty of Paying Living Expenses: Not hard at all  Food Insecurity: No Food Insecurity (01/06/2022)   Hunger Vital Sign    Worried About Running Out of Food in the Last Year: Never true    Ran Out of Food in the Last Year: Never true  Transportation Needs: No Transportation Needs (01/06/2022)   PRAPARE - Administrator, Civil Service (Medical): No    Lack of Transportation (Non-Medical): No  Physical Activity: Sufficiently Active (01/06/2022)   Exercise Vital Sign    Days of Exercise per Week: 7 days    Minutes of Exercise per Session: 30 min  Stress: No Stress Concern Present (01/06/2022)   Miguel Peters of Occupational Health - Occupational Stress Questionnaire    Feeling of Stress : Not at all  Social Connections: Socially  Isolated (11/05/2020)   Social Connection and Isolation Panel    Frequency of Communication with Friends and Family: Once a week    Frequency of Social Gatherings with Friends and Family: Once a week    Attends Religious Services: Never    Database administrator or Organizations: No    Attends Banker Meetings: Never    Marital Status: Never married   Vitals:   02/02/24 0933  BP: (!) 140/80  Pulse: 100  Resp: 16  SpO2: 97%   Body mass index is 23.39 kg/m.  Physical Exam Vitals and nursing note reviewed.  Constitutional:      General: He is not in acute distress.    Appearance: He is well-developed.  HENT:  Head: Normocephalic and atraumatic.  Eyes:     Conjunctiva/sclera: Conjunctivae normal.  Neck:     Vascular: No JVD.  Cardiovascular:     Rate and Rhythm: Normal rate and regular rhythm.     Heart sounds: No murmur heard. Pulmonary:     Effort: Pulmonary effort is normal. No respiratory distress.     Breath sounds: Normal breath sounds.  Chest:     Chest wall: Tenderness present.    Abdominal:     Palpations: Abdomen is soft. There is no hepatomegaly or mass.     Tenderness: There is no abdominal tenderness.  Musculoskeletal:     Left shoulder: Tenderness present. Decreased range of motion.     Cervical back: Tenderness present. Muscular tenderness present. Decreased range of motion.     Right lower leg: No edema.     Left lower leg: No edema.     Comments:  Pt expressed discomfort with palpation applied over trapezius muscle, while turning head leftward, and lifting left arm above head.  Lymphadenopathy:     Cervical: No cervical adenopathy.  Skin:    General: Skin is warm.     Findings: No erythema or rash.  Neurological:     General: No focal deficit present.     Mental Status: He is alert and oriented to person, place, and time.     Comments: Otherwise stable gait, not assisted.  Psychiatric:        Mood and Affect: Mood and affect  normal.   ASSESSMENT AND PLAN: [name]  was seen here today for Left Arm/Shoulder Pain.  Cervical radiculopathy LUE pain and mild tingling with associated neck pain. Differential Dx discussed. After discussion of some side effects, he agrees with Prednisone  taper, instructed to take medication with breakfast. Monitor for new symptoms. F/U in 3 weeks, before if needed.  -     DG Cervical Spine Complete; Future -     predniSONE ; 3 tabs for 2 days, 2 tabs for 3 days, 1 tabs for 3 days, and 1/2 tab for 3 days. Take tables together with breakfast.  Dispense: 17 tablet; Refill: 0  Neck pain on left side Most likely related to OA. Cervical X ray will be obtained today. Tramadol  50 mg specially at bedtime for pain management and tizanidine  2-4 mg at bedtime x 14 days recommended. Side effects discussed.  -     DG Cervical Spine Complete; Future -     traMADol  HCl; Take 1 tablet (50 mg total) by mouth every 12 (twelve) hours as needed for up to 5 days.  Dispense: 10 tablet; Refill: 0 -     tiZANidine  HCl; Take 0.5-1 tablets (2-4 mg total) by mouth at bedtime.  Dispense: 30 tablet; Refill: 0  Other chest pain Hx is more suggestive of musculoskeletal pain. Other possible etiologies discussed. EKG today: NSR, normal axis and intervals, LAE and voltage criteria for LVH. No acute ischemic changes appreciated. Compared with EKG in 11/2022 no significant changes. CXR ordered. Clearly instructed about warning signs. F/U in 3 weeks.  -     EKG 12-Lead -     DG Chest 2 View; Future  Elevated blood pressure reading Mildly elevated SBP. Currently on Metoprolol  succinate 25 mg daily. Will re-check in 3 weeks.  Return in about 3 weeks (around 02/23/2024).  I,Emily Lagle,acting as a Neurosurgeon for Tiwana Chavis [place], MD.,have documented all relevant documentation on the behalf of Bettylee Feig [place], MD,as directed by  Rema Lievanos [place], MD while in  the presence of Genavieve Mangiapane [place], MD.  I, Shaan Rhoads [place], MD,  have reviewed all documentation for this visit. The documentation on 02/02/24 for the exam, diagnosis, procedures, and orders are all accurate and complete. Darneshia Demary G. [place], MD  [facility]. [place] office.

## 2024-02-04 ENCOUNTER — Emergency Department (HOSPITAL_COMMUNITY)
Admission: EM | Admit: 2024-02-04 | Discharge: 2024-02-04 | Disposition: A | Discharge status: Home / Self Care | Attending: Emergency Medicine | Admitting: Emergency Medicine

## 2024-02-04 ENCOUNTER — Encounter (HOSPITAL_COMMUNITY): Payer: Self-pay

## 2024-02-04 ENCOUNTER — Other Ambulatory Visit: Payer: Self-pay

## 2024-02-04 ENCOUNTER — Emergency Department (HOSPITAL_COMMUNITY)

## 2024-02-04 DIAGNOSIS — Z7982 Long term (current) use of aspirin: Secondary | ICD-10-CM | POA: Insufficient documentation

## 2024-02-04 DIAGNOSIS — S46912A Strain of unspecified muscle, fascia and tendon at shoulder and upper arm level, left arm, initial encounter: Secondary | ICD-10-CM | POA: Diagnosis not present

## 2024-02-04 DIAGNOSIS — M25512 Pain in left shoulder: Secondary | ICD-10-CM | POA: Diagnosis not present

## 2024-02-04 DIAGNOSIS — X500XXA Overexertion from strenuous movement or load, initial encounter: Secondary | ICD-10-CM | POA: Diagnosis not present

## 2024-02-04 MED ORDER — LIDOCAINE 5 % EX PTCH: 1.0000 | MEDICATED_PATCH | Freq: Every day | CUTANEOUS | 0 refills | Status: AC | PRN

## 2024-02-04 MED ORDER — OXYCODONE HCL 5 MG PO TABS: 5.0000 mg | ORAL_TABLET | Freq: Four times a day (QID) | ORAL | 0 refills | Status: AC | PRN

## 2024-02-04 NOTE — ED Provider Notes (Signed)
 [place] [facility]ERGENC[facility] DEPARTMENT AT [facility] Provider Note  CSN: [id number] Arrival date & time: 02/04/24 1052  Chief Complaint(s) Shoulder Pain  HPI Markevius Trombetta Cherry  is a 73 y.o. male with past medical history as below, significant for sickle cell trait, HLD, stutter, Bell's palsy who presents to the ED with complaint of shoulder pain  Reports he been having left-sided shoulder pain for the past few days after lifting up some heavy bags.  Pain is worsening with movement and raising his arm over his head.  He was seen by PCP and had x-ray of his chest and C-spine performed, also started on tramadol  and muscle relaxer.  Feels the muscle relaxer did significant improve his discomfort.  He still having some lingering pain to the superior aspect of his left shoulder.  No numbness or weakness to affected extremity.  No falls or traumatic injuries reported  Past Medical History Past Medical History:  Diagnosis Date   Arthritis    Bell's palsy 2016   Right sided.  Not clear when acutely suffered this.  Was noted on exam after years of no medical attention 07/2014.  Evaluated by [facility]  subsequently.   Hyperlipidemia    Sickle cell trait (HCC)    Stutter age 73   cannot recall if something happened in life that set this off.   Weight loss 02/12/2017   Patient Active Problem List   Diagnosis Date Noted   Sickle cell trait (HCC) 04/16/2023   Routine general medical examination at a health care facility 04/16/2023   Primary osteoarthritis of both knees 06/12/2022   Right epiphora 06/12/2022   Aortic atherosclerosis (HCC) 07/25/2021   Chronic pruritic rash in adult 12/28/2017   Hyperlipidemia 08/30/2017   Chronic lower back pain 08/30/2017   Stutter 01/08/2017   Right-sided Bell's palsy 09/11/2014   Bell's palsy 07/27/2014   Home Medication(s) Prior to Admission medications   Medication Sig Start Date End Date Taking? Authorizing Provider  artificial tears  (LACRILUBE) OINT ophthalmic ointment Place into both eyes at bedtime. 06/12/22   [place], Betty G, MD  aspirin 81 MG tablet Take 81 mg by mouth daily.    [provider]  atorvastatin  (LIPITOR) 40 MG tablet TAKE 1 TABLET(40 MG) BY MOUTH DAILY 01/21/23   Thukkani, Arun K, MD  Bempedoic Acid-Ezetimibe  (NEXLIZET ) 180-10 MG TABS Take 1 tablet by mouth daily. STOP ezetimibe  02/22/23   Thukkani, Arun K, MD  calcipotriene (DOVONOX) 0.005 % cream APP TOPICALLY TO GROIN AND BUTTOCKS BID 08/27/18   [provider]  ibuprofen (ADVIL) 200 MG tablet Take 200 mg by mouth every 6 (six) hours as needed.    [provider]  metoprolol  succinate (TOPROL -XL) 25 MG 24 hr tablet TAKE 1 TABLET(25 MG) BY MOUTH AT BEDTIME 02/02/23   Thukkani, Arun K, MD  predniSONE  (DELTASONE ) 20 MG tablet 3 tabs for 2 days, 2 tabs for 3 days, 1 tabs for 3 days, and 1/2 tab for 3 days. Take tables together with breakfast. 02/02/24 02/10/24  [place], Betty G, MD  tiZANidine  (ZANAFLEX ) 4 MG tablet Take 0.5-1 tablets (2-4 mg total) by mouth at bedtime. 02/02/24   [place], Betty G, MD  traMADol  (ULTRAM ) 50 MG tablet Take 1 tablet (50 mg total) by mouth every 12 (twelve) hours as needed for up to 5 days. 02/02/24 02/07/24  [place], Betty G, MD  Past Surgical History Past Surgical History:  Procedure Laterality Date   COLONOSCOPY  10/05/2014   db 3 TAs   COLONOSCOPY  01/25/2020   hand surgery     thumb   POLYPECTOMY     TONSILLECTOMY     Family History Family History  Problem Relation Age of Onset   Sickle cell anemia Mother    Kidney disease Father    Alcohol abuse Father    Colon cancer Neg Hx    Esophageal cancer Neg Hx    Stomach cancer Neg Hx    Rectal cancer Neg Hx    Colon polyps Neg Hx     Social History Social History   Tobacco Use   Smoking status: Never   Smokeless tobacco:  Never  Vaping Use   Vaping status: Never Used  Substance Use Topics   Alcohol use: Yes    Alcohol/week: 0.0 standard drinks of alcohol    Comment: Once a monthly   Drug use: No   Allergies Patient has no known allergies.  Review of Systems A thorough review of systems was obtained and all systems are negative except as noted in the HPI and PMH.   Physical Exam Vital Signs  I have reviewed the triage vital signs BP (!) 168/84 (BP Location: Right Arm)   Pulse 76   Temp 97.7 F (36.5 C) (Oral)   Resp 14   Ht 6' 3 (1.905 m)   Wt 83.9 kg   SpO2 98%   BMI 23.12 kg/m  Physical Exam Vitals and nursing note reviewed.  Constitutional:      General: He is not in acute distress.    Appearance: Normal appearance. He is well-developed. He is not ill-appearing.  HENT:     Head: Normocephalic and atraumatic.     Right Ear: External ear normal.     Left Ear: External ear normal.     Nose: Nose normal.     Mouth/Throat:     Mouth: Mucous membranes are moist.  Eyes:     General: No scleral icterus.       Right eye: No discharge.        Left eye: No discharge.  Cardiovascular:     Rate and Rhythm: Normal rate.  Pulmonary:     Effort: Pulmonary effort is normal. No respiratory distress.     Breath sounds: No stridor.  Abdominal:     General: Abdomen is flat. There is no distension.     Tenderness: There is no guarding.  Musculoskeletal:        General: No deformity.       Arms:     Cervical back: No rigidity.     Comments: Upper extremities NVI bilateral  Skin:    General: Skin is warm and dry.     Coloration: Skin is not cyanotic, jaundiced or pale.  Neurological:     Mental Status: He is alert and oriented to person, place, and time.     GCS: GCS eye subscore is 4. GCS verbal subscore is 5. GCS motor subscore is 6.  Psychiatric:        Speech: Speech normal.        Behavior: Behavior normal. Behavior is cooperative.     ED Results and Treatments Labs (all labs  ordered are listed, but only abnormal results are displayed) Labs Reviewed - No data to display  Radiology DG Shoulder Left Result Date: 02/04/2024 CLINICAL DATA:  pain, left shoulder pain EXAM: LEFT SHOULDER - 2+ VIEW COMPARISON:  None Available. FINDINGS: No acute fracture or dislocation. Mild AC joint space loss, likely degenerative. Soft tissues are unremarkable. Multilevel thoracic osteophytosis. IMPRESSION: No acute fracture or dislocation. Electronically Signed   By: Rogelia Myers M.D.   On: 02/04/2024 12:30    Pertinent labs & imaging results that were available during my care of the patient were reviewed by me and considered in my medical decision making (see MDM for details).  Medications Ordered in ED Medications - No data to display                                                                                                                                   Procedures Procedures  (including critical care time)  Medical Decision Making / ED Course    Medical Decision Making:    Raoul Ciano Nygard  is a 73 y.o. male with past medical history as below, significant for sickle cell trait, HLD, stutter, Bell's palsy who presents to the ED with complaint of shoulder pain. The complaint involves an extensive differential diagnosis and also carries with it a high risk of complications and morbidity.  Serious etiology was considered. Ddx includes but is not limited to: Sprain, strain, dislocation, contusion, muscle spasm, etc.  Complete initial physical exam performed, notably the patient was in no distress, sitting comfortably on stretcher.    Reviewed and confirmed nursing documentation for past medical history, family history, social history.  Vital signs reviewed.    Left shoulder pain> - Pain localized to the left shoulder, no pain down his chest,  palpitations or dyspnea.  No fevers. - Pain after lifting heavy object, has improved after starting muscle relaxer - Exam is reassuring, he is NVI.  X-ray is reassuring - Favor likely MSK injury, will give sling, adjust home medication regimen  Patient in no distress and overall condition is stable. Detailed discussions were had with the patient/guardian regarding current findings, and need for close f/u with PCP or on call doctor. The patient/guardian has been instructed to return immediately if the symptoms worsen in any way for re-evaluation. Patient/guardian verbalized understanding and is in agreement with current care plan. All questions answered prior to discharge.                       Additional history obtained: -Additional history obtained from family -External records from outside source obtained and reviewed including: Chart review including previous notes, labs, imaging, consultation notes including  Primary care documentation, home medications, PDMP   Lab Tests: na  EKG   EKG Interpretation Date/Time:    Ventricular Rate:    PR Interval:    QRS Duration:    QT Interval:    QTC Calculation:   R Axis:      Text Interpretation:  Imaging Studies ordered: I ordered imaging studies including shoulder xr I independently visualized the following imaging with scope of interpretation limited to determining acute life threatening conditions related to emergency care; findings noted above I agree with the radiologist interpretation If any imaging was obtained with contrast I closely monitored patient for any possible adverse reaction a/w contrast administration in the emergency department   Medicines ordered and prescription drug management: No orders of the defined types were placed in this encounter.   -I have reviewed the patients home medicines and have made adjustments as needed   Consultations Obtained: na   Cardiac  Monitoring: Continuous pulse oximetry interpreted by myself, 100% on ra.    Social Determinants of Health:  Diagnosis or treatment significantly limited by social determinants of health: na   Reevaluation: After the interventions noted above, I reevaluated the patient and found that they have improved  Co morbidities that complicate the patient evaluation  Past Medical History:  Diagnosis Date   Arthritis    Bell's palsy 2016   Right sided.  Not clear when acutely suffered this.  Was noted on exam after years of no medical attention 07/2014.  Evaluated by [facility]  subsequently.   Hyperlipidemia    Sickle cell trait (HCC)    Stutter age 21   cannot recall if something happened in life that set this off.   Weight loss 02/12/2017      Dispostion: Disposition decision including need for hospitalization was considered, and patient discharged from emergency department.    Final Clinical Impression(s) / ED Diagnoses Final diagnoses:  Strain of left shoulder, initial encounter        Elnor Jayson LABOR, DO 02/04/24 1443

## 2024-02-04 NOTE — ED Triage Notes (Signed)
 C/O left shoulder pain that started July 5th. Denies recent falls/ trauma. Pt states he was lifting heavier than usual.

## 2024-02-04 NOTE — Discharge Instructions (Signed)
 It was a pleasure caring for you today in the emergency department.  Please stop taking tramadol .  When you add ibuprofen and acetaminophen every 4-6 hours per instructions on packaging.  Lidocaine  patch can also be helpful.  Recommend no heavy lifting to your left arm for the next 5 days  Please return to the emergency department for any worsening or worrisome symptoms.

## 2024-02-05 ENCOUNTER — Ambulatory Visit: Payer: Self-pay | Admitting: Family Medicine

## 2024-02-07 ENCOUNTER — Other Ambulatory Visit: Payer: Self-pay

## 2024-02-07 ENCOUNTER — Telehealth: Payer: Self-pay | Admitting: Family Medicine

## 2024-02-07 MED ORDER — DOXYCYCLINE HYCLATE 100 MG PO TABS: 100.0000 mg | ORAL_TABLET | Freq: Two times a day (BID) | ORAL | 0 refills | Status: AC

## 2024-02-07 NOTE — Telephone Encounter (Signed)
 FYI

## 2024-02-07 NOTE — Telephone Encounter (Signed)
 Copied from CRM [id number]. Topic: General - Call Back - No Documentation >> Feb 07, 2024 12:45 PM Leah C wrote: Reason for CRM: Patient called back in to let clinic know about her ED visit on Friday and how he was prescribed a stronger medication for his shoulder and back pain.

## 2024-02-08 ENCOUNTER — Telehealth: Payer: Self-pay

## 2024-02-08 ENCOUNTER — Other Ambulatory Visit: Payer: Self-pay | Admitting: Internal Medicine

## 2024-02-08 ENCOUNTER — Telehealth: Payer: Self-pay | Admitting: Family Medicine

## 2024-02-08 DIAGNOSIS — M25519 Pain in unspecified shoulder: Secondary | ICD-10-CM

## 2024-02-08 NOTE — Telephone Encounter (Signed)
 Copied from CRM [id number]. Topic: Referral - Request for Referral >> Feb 08, 2024  2:43 PM Carlyon D wrote: Did the patient discuss referral with their provider in the last year? No (If No - schedule appointment) (If Yes - send message)  Appointment offered? Yes  Type of order/referral and detailed reason for visit: Pt said his shoulder is still bothering him and he would like to be referred to a specialist.   Preference of office, provider, location: no preference   If referral order, have you been seen by this specialty before? No (If Yes, this issue or another issue? When? Where?  Can we respond through MyChart? Yes

## 2024-02-08 NOTE — Telephone Encounter (Signed)
 Referral to Ortho for shoulder pain has been entered. They will contact pt to get him scheduled.

## 2024-02-08 NOTE — Telephone Encounter (Signed)
 Duplicate, see other telephone note

## 2024-02-10 ENCOUNTER — Other Ambulatory Visit: Payer: Self-pay | Admitting: Internal Medicine

## 2024-02-23 ENCOUNTER — Ambulatory Visit (INDEPENDENT_AMBULATORY_CARE_PROVIDER_SITE_OTHER): Admitting: Family Medicine

## 2024-02-23 ENCOUNTER — Encounter: Payer: Self-pay | Admitting: Family Medicine

## 2024-02-23 VITALS — BP 122/70 | HR 67 | Resp 16 | Ht 75.0 in | Wt 183.0 lb

## 2024-02-23 DIAGNOSIS — R059 Cough, unspecified: Secondary | ICD-10-CM | POA: Diagnosis not present

## 2024-02-23 DIAGNOSIS — H6123 Impacted cerumen, bilateral: Secondary | ICD-10-CM

## 2024-02-23 DIAGNOSIS — R030 Elevated blood-pressure reading, without diagnosis of hypertension: Secondary | ICD-10-CM

## 2024-02-23 DIAGNOSIS — M25512 Pain in left shoulder: Secondary | ICD-10-CM | POA: Diagnosis not present

## 2024-02-23 NOTE — Patient Instructions (Addendum)
 A few things to remember from today's visit:  Cough, unspecified type  Left shoulder pain, unspecified chronicity  Elevated blood pressure reading  Excessive cerumen in both ear canals  If you need refills for medications you take chronically, please call your pharmacy. Do not use My Chart to request refills or for acute issues that need immediate attention. If you send a my chart message, it may take a few days to be addressed, specially if I am not in the office.  Please be sure medication list is accurate. If a new problem present, please set up appointment sooner than planned today.

## 2024-02-23 NOTE — Progress Notes (Signed)
 HPI: [name]  is a 73 y.o. male with a PMHx significant for CAD, HLD, aortic atherosclerosis, right-sided Bell's palsy, and OA, who is here today for 3-Week Follow-up for Left Arm/Shoulder Pain, seen on 02/02/2024  Initially seen in this office on 7/09 for left arm/shoulder pain w/ associated left-sided neck pain and secondary chest discomfort. For pain relief, he was prescribed prednisone  taper, tramadol  50 mg q12 hours as needed, and Tizanidine  2-4 mg at bedtime.  02/02/2024 Cervical Spine: Severe multilevel degenerative changes of the cervical spine. Subsequently, seen in ED on 7/11 for worsening pain where he was prescribed Lidocaine  5% patches and Oxycodone  5 mg q6 hours as needed w/ Tramadol  discontinued due to ineffectiveness. 02/04/2024 Left Shoulder View: No acute fracture or dislocation.  Today, he says that his pain has significantly improved with arm pain completely resolving and only mild shoulder pain lingering.   Scheduled to see Orthopedist, PA-C Charles Magnant on 8/07 for knee OA.  CXR done due to his secondary chest discomfort, 02/02/2024 CXR showed mild patchy opacity at the RIGHT lung base favored to be atelectasis than pneumonia.  Completed treatment with doxycycline  100 mg twice daily for 7 days.  Regarding these findings, he says he has a little cough w/ expectoration of saliva, but no hemoptysis, difficulty breathing, or wheezing.  Also has had occasional dysphonia that resolves with cough/throat clearing.  Last visit his BP was mildly elevated. BP readings at home: not checked Negative for unusual or severe headache, visual changes, exertional chest pain, focal weakness, or edema. BP Readings from Last 3 Encounters:  02/23/24 122/70  02/04/24 (!) 168/84  02/02/24 (!) 140/80   He would like to have ears checked, history of cerumen excess.  Negative for hearing changes or other related symptoms.  Review of Systems  Constitutional:  Negative for  activity change, appetite change, chills and fever.  HENT:  Positive for postnasal drip and rhinorrhea. Negative for ear pain, mouth sores, sore throat and trouble swallowing.   Eyes:  Negative for discharge and redness.  Gastrointestinal:  Negative for abdominal pain, nausea and vomiting.  Genitourinary:  Negative for decreased urine volume, dysuria and hematuria.  Musculoskeletal:  Positive for arthralgias (left shoulder to neck).  Skin:  Negative for rash.  Neurological:  Negative for weakness and headaches.  Psychiatric/Behavioral:  Negative for confusion and hallucinations.   See other pertinent positives and negatives in HPI.  Current Outpatient Medications on File Prior to Visit  Medication Sig Dispense Refill   artificial tears (LACRILUBE) OINT ophthalmic ointment Place into both eyes at bedtime. 5 g 2   aspirin 81 MG tablet Take 81 mg by mouth daily.     atorvastatin  (LIPITOR) 40 MG tablet TAKE 1 TABLET(40 MG) BY MOUTH DAILY 90 tablet 3   Bempedoic Acid-Ezetimibe  (NEXLIZET ) 180-10 MG TABS Take 1 tablet by mouth daily. STOP ezetimibe  90 tablet 3   calcipotriene (DOVONOX) 0.005 % cream APP TOPICALLY TO GROIN AND BUTTOCKS BID     ibuprofen (ADVIL) 200 MG tablet Take 200 mg by mouth every 6 (six) hours as needed.     lidocaine  (LIDODERM ) 5 % Place 1 patch onto the skin daily as needed. Remove & Discard patch within 12 hours or as directed by MD 15 patch 0   metoprolol  succinate (TOPROL -XL) 25 MG 24 hr tablet Take 1 tablet (25 mg total) by mouth daily. PT. MUST MAKE AN APPOINTMENT IN ORDER TO RECEIVE FUTURE REFILLS, FIRST ATTEMPT. 30 tablet 0   oxyCODONE  (ROXICODONE )  5 MG immediate release tablet Take 1 tablet (5 mg total) by mouth every 6 (six) hours as needed for severe pain (pain score 7-10). 8 tablet 0   tiZANidine  (ZANAFLEX ) 4 MG tablet Take 0.5-1 tablets (2-4 mg total) by mouth at bedtime. 30 tablet 0   No current facility-administered medications on file prior to visit.    Past  Medical History:  Diagnosis Date   Arthritis    Bell's palsy 2016   Right sided.  Not clear when acutely suffered this.  Was noted on exam after years of no medical attention 07/2014.  Evaluated by [facility]  subsequently.   Hyperlipidemia    Sickle cell trait (HCC)    Stutter age 80   cannot recall if something happened in life that set this off.   Weight loss 02/12/2017   No Known Allergies  Social History   Socioeconomic History   Marital status: Single    Spouse name: Not on file   Number of children: 0   Years of education: 12   Highest education level: 12th grade  Occupational History   Occupation: Maintenance/Housekeeping    Employer: [employer]    Comment: 2016  Tobacco Use   Smoking status: Never   Smokeless tobacco: Never  Vaping Use   Vaping status: Never Used  Substance and Sexual Activity   Alcohol use: Yes    Alcohol/week: 0.0 standard drinks of alcohol    Comment: Once a monthly   Drug use: No   Sexual activity: Yes    Partners: Female  Other Topics Concern   Not on file  Social History Narrative   Was living at a boarding house.   Patient was having difficulty affording food--reportedly because he lived in boarding house and did not have utilitiy bills, his EBT dollars were cut to $13 to $15 per month.   That has been changed.  He now receives adequate EBT dollars.      Now living at [facility] [employer] [facility] [employer].  This is for folks with a disability.  He has an apartment now with a kitchen.  [facility] near him set him up in these apartments that they support.     [facility] [phone]      09/05/18: Pt. Still living at [facility] [address], likes it. Sometimes participates in events hosted by center there.   Lives alone, but sister local, and main source of support.   Enjoys doing own workout routine with weights and walking. Enjoys reading.      Social Drivers of Corporate investment banker Strain: Low Risk  (01/06/2022)   Overall  Financial Resource Strain (CARDIA)    Difficulty of Paying Living Expenses: Not hard at all  Food Insecurity: No Food Insecurity (01/06/2022)   Hunger Vital Sign    Worried About Running Out of Food in the Last Year: Never true    Ran Out of Food in the Last Year: Never true  Transportation Needs: No Transportation Needs (01/06/2022)   PRAPARE - Administrator, Civil Service (Medical): No    Lack of Transportation (Non-Medical): No  Physical Activity: Sufficiently Active (01/06/2022)   Exercise Vital Sign    Days of Exercise per Week: 7 days    Minutes of Exercise per Session: 30 min  Stress: No Stress Concern Present (01/06/2022)   [employer] of Occupational Health - Occupational Stress Questionnaire    Feeling of Stress : Not at all  Social Connections: Socially Isolated (  11/05/2020)   Social Connection and Isolation Panel    Frequency of Communication with Friends and Family: Once a week    Frequency of Social Gatherings with Friends and Family: Once a week    Attends Religious Services: Never    Database administrator or Organizations: No    Attends Banker Meetings: Never    Marital Status: Never married   Vitals:   02/23/24 0902  BP: 122/70  Pulse: 67  Resp: 16  SpO2: 97%   Body mass index is 22.87 kg/m.  Physical Exam Vitals and nursing note reviewed.  Constitutional:      General: He is not in acute distress.    Appearance: He is well-developed.  HENT:     Head: Normocephalic and atraumatic.     Right Ear: External ear normal.     Left Ear: External ear normal.     Ears:     Comments: Bilateral cerumen excess in ear canals, was able to see left TM partially. Eyes:     Conjunctiva/sclera: Conjunctivae normal.  Cardiovascular:     Rate and Rhythm: Normal rate and regular rhythm.     Heart sounds: No murmur heard.    Comments: PT pulses palpable. Pulmonary:     Effort: Pulmonary effort is normal. No respiratory distress.     Breath  sounds: Normal breath sounds.  Abdominal:     Palpations: Abdomen is soft. There is no hepatomegaly or mass.     Tenderness: There is no abdominal tenderness.  Musculoskeletal:     Right shoulder: Normal. Normal range of motion.     Left shoulder: Normal range of motion.       Arms:  Lymphadenopathy:     Cervical: No cervical adenopathy.  Skin:    General: Skin is warm.     Findings: No erythema or rash.  Neurological:     General: No focal deficit present.     Mental Status: He is alert and oriented to person, place, and time.     Gait: Gait normal.  Psychiatric:        Mood and Affect: Mood and affect normal.    ASSESSMENT AND PLAN: [name]  was seen here today for 3-Week Follow-up for left arm/shoulder pain.  Left shoulder pain, unspecified chronicity Most likely OA. Left shoulder x-rays showed mild AC joint space loss, likely degenerative. Left upper extremity pain has resolved after completing prednisone  taper. Still having some residual pain. He follows with orthopedics for knee pain, recommend mentioning this problem to provider.  Cough, unspecified type Problem has improved. Chest x-ray done on 02/02/2024 showed mild patchy opacity at the right lung base, most likely atelectasis but treated empirically for possible pneumonia with doxycycline  for 7 days. Will plan on repeating chest x-ray in 03/2024, before if needed.    Elevated blood pressure reading BP is better today. Currently he is on metoprolol  succinate 25 mg daily for nonobstructive CAD. Recommend monitoring BP at home.  Excessive cerumen in both ear canals There is no cerumen impaction. Recommend avoiding use of Q-tips. OTC ceruminolytic medication may help.  Return if symptoms worsen or fail to improve, for keep next appointment. I,Emily Lagle,acting as a Neurosurgeon for Lanell Carpenter [place], MD.,have documented all relevant documentation on the behalf of Saray Capasso [place], MD,as directed by  Treazure Nery [place], MD  while in the presence of Jamaiyah Pyle [place], MD.  I, Kolbe Delmonaco [place], MD, have reviewed all documentation for this visit. The documentation on 02/23/24 for the  exam, diagnosis, procedures, and orders are all accurate and complete.  Haislee Corso G. [place], MD  [facility]. [place] office.

## 2024-03-02 ENCOUNTER — Ambulatory Visit (INDEPENDENT_AMBULATORY_CARE_PROVIDER_SITE_OTHER): Admitting: Surgical

## 2024-03-02 DIAGNOSIS — M25512 Pain in left shoulder: Secondary | ICD-10-CM | POA: Diagnosis not present

## 2024-03-07 ENCOUNTER — Other Ambulatory Visit: Payer: Self-pay | Admitting: Internal Medicine

## 2024-03-07 ENCOUNTER — Other Ambulatory Visit: Payer: Self-pay | Admitting: Family Medicine

## 2024-03-07 DIAGNOSIS — M542 Cervicalgia: Secondary | ICD-10-CM

## 2024-03-12 ENCOUNTER — Encounter: Payer: Self-pay | Admitting: Surgical

## 2024-03-12 NOTE — Progress Notes (Signed)
 Office Visit Note   Patient: Miguel Peters            Date of Birth: [birth year]           MRN: [id number] Visit Date: 03/02/2024 Requested by: [place], Betty G, MD [address] [place],  [place] [address] PCP: [place], Betty G, MD  Subjective: Chief Complaint  Patient presents with   Left Shoulder - Pain    HPI: Miguel Peters  is a 73 y.o. male who presents to the office reporting left shoulder pain for 2 weeks.  He was lifting a lot of grocery bags 2 weeks ago and felt a stinging sensation in the superior aspect of his shoulder near the trapezius muscle and in the posterior shoulder and the shoulder blade region.  This stinging sensation progressed to involve radicular symptoms down his arm but he saw his PCP who prescribed muscle relaxer and this has provided substantial relief for [facility].  The radicular pain has resolved and the trapezius/left sided neck pain/scapular pain is about 60% improved.  Still improving and he has not plateaued yet.  He is right-hand dominant..                ROS: All systems reviewed are negative as they relate to the chief complaint within the history of present illness.  Patient denies fevers or chills.  Assessment & Plan: Visit Diagnoses:  1. Acute pain of left shoulder     Plan: Impression is 73 year old male with what sounds like cervical radiculopathy after lifting a lot of grocery bags.  Fortunately, his symptoms have substantially improved by about 60% in the last 2 weeks and he has not plateaued yet.  We discussed further options available to patient and he would like to hold off on any intervention for now.  If symptoms recur or do not resolve, could consider steroid Dosepak or further imaging.  For now, patient will follow-up as needed.  Follow-Up Instructions: Return if symptoms worsen or fail to improve.   Orders:  No orders of the defined types were placed in this encounter.  No orders of the defined types were placed in this  encounter.     Procedures: No procedures performed   Clinical Data: No additional findings.  Objective: Vital Signs: There were no vitals taken for this visit.  Physical Exam:  Constitutional: Patient appears well-developed HEENT:  Head: Normocephalic Eyes:EOM are normal Neck: Normal range of motion Cardiovascular: Normal rate Pulmonary/chest: Effort normal Neurologic: Patient is alert Skin: Skin is warm Psychiatric: Patient has normal mood and affect  Ortho Exam: Ortho exam demonstrates intact EPL, FPL, finger abduction, pronation/supination, bicep, tricep, deltoid bilaterally.  Axillary nerve intact with deltoid firing bilaterally.  Intact trapezius shrug bilaterally.  No pain with cervical spine range of motion.  Negative Lhermitte sign.  Negative Spurling sign.  Intact rotator cuff strength  Specialty Comments:  No specialty comments available.  Imaging: No results found.   PMFS History: Patient Active Problem List   Diagnosis Date Noted   Sickle cell trait (HCC) 04/16/2023   Routine general medical examination at a health care facility 04/16/2023   Primary osteoarthritis of both knees 06/12/2022   Right epiphora 06/12/2022   Aortic atherosclerosis (HCC) 07/25/2021   Chronic pruritic rash in adult 12/28/2017   Hyperlipidemia 08/30/2017   Chronic lower back pain 08/30/2017   Stutter 01/08/2017   Right-sided Bell's palsy 09/11/2014   Bell's palsy 07/27/2014   Past Medical History:  Diagnosis Date   Arthritis  Bell's palsy 2016   Right sided.  Not clear when acutely suffered this.  Was noted on exam after years of no medical attention 07/2014.  Evaluated by [facility]  subsequently.   Hyperlipidemia    Sickle cell trait (HCC)    Stutter age 28   cannot recall if something happened in life that set this off.   Weight loss 02/12/2017    Family History  Problem Relation Age of Onset   Sickle cell anemia Mother    Kidney disease Father     Alcohol abuse Father    Colon cancer Neg Hx    Esophageal cancer Neg Hx    Stomach cancer Neg Hx    Rectal cancer Neg Hx    Colon polyps Neg Hx     Past Surgical History:  Procedure Laterality Date   COLONOSCOPY  10/05/2014   db 3 TAs   COLONOSCOPY  01/25/2020   hand surgery     thumb   POLYPECTOMY     TONSILLECTOMY     Social History   Occupational History   Occupation: Maintenance/Housekeeping    Employer: [employer]    Comment: 2016  Tobacco Use   Smoking status: Never   Smokeless tobacco: Never  Vaping Use   Vaping status: Never Used  Substance and Sexual Activity   Alcohol use: Yes    Alcohol/week: 0.0 standard drinks of alcohol    Comment: Once a monthly   Drug use: No   Sexual activity: Yes    Partners: Female

## 2024-04-17 ENCOUNTER — Ambulatory Visit: Payer: 59 | Admitting: Family Medicine

## 2024-04-17 ENCOUNTER — Encounter: Payer: Self-pay | Admitting: Family Medicine

## 2024-04-17 VITALS — BP 130/70 | HR 69 | Temp 98.0°F | Resp 16 | Ht 74.41 in | Wt 187.4 lb

## 2024-04-17 DIAGNOSIS — M67441 Ganglion, right hand: Secondary | ICD-10-CM | POA: Diagnosis not present

## 2024-04-17 DIAGNOSIS — Z23 Encounter for immunization: Secondary | ICD-10-CM

## 2024-04-17 DIAGNOSIS — Z125 Encounter for screening for malignant neoplasm of prostate: Secondary | ICD-10-CM

## 2024-04-17 DIAGNOSIS — Z0000 Encounter for general adult medical examination without abnormal findings: Secondary | ICD-10-CM

## 2024-04-17 DIAGNOSIS — E785 Hyperlipidemia, unspecified: Secondary | ICD-10-CM

## 2024-04-17 LAB — COMPREHENSIVE METABOLIC PANEL WITH GFR
ALT: 12 U/L (ref 0–53)
AST: 15 U/L (ref 0–37)
Albumin: 4.3 g/dL (ref 3.5–5.2)
Alkaline Phosphatase: 60 U/L (ref 39–117)
BUN: 11 mg/dL (ref 6–23)
CO2: 30 meq/L (ref 19–32)
Calcium: 9.7 mg/dL (ref 8.4–10.5)
Chloride: 103 meq/L (ref 96–112)
Creatinine, Ser: 1.03 mg/dL (ref 0.40–1.50)
GFR: 73.01 mL/min (ref >60.00)
Glucose, Bld: 78 mg/dL (ref 70–99)
Potassium: 3.7 meq/L (ref 3.5–5.1)
Sodium: 140 meq/L (ref 135–145)
Total Bilirubin: 0.9 mg/dL (ref 0.2–1.2)
Total Protein: 6.5 g/dL (ref 6.0–8.3)

## 2024-04-17 LAB — LIPID PANEL
Cholesterol: 195 mg/dL (ref 0–200)
HDL: 56.9 mg/dL (ref >39.00)
LDL Cholesterol: 125 mg/dL — ABNORMAL HIGH (ref 0–99)
NonHDL: 138.51
Total CHOL/HDL Ratio: 3
Triglycerides: 70 mg/dL (ref 0.0–149.0)
VLDL: 14 mg/dL (ref 0.0–40.0)

## 2024-04-17 LAB — PSA: PSA: 0.94 ng/mL (ref 0.10–4.00)

## 2024-04-17 NOTE — Progress Notes (Unsigned)
 Chief Complaint  Patient presents with   Annual Exam   Discussed the use of AI scribe software for clinical note transcription with the patient, who gave verbal consent to proceed.  History of Present Illness Miguel Peters  is a 73 year old male with PMHx significant for CAD,HLD, OA, and right-sdied Bell's palsy with residual weakness here today for annual physical exam.  He recently visited an orthopedist for his left shoulder and is currently undergoing therapy. He engages in regular exercise, including walking three times a week for about an hour and stretching exercises.  He maintains a diet that includes daily vegetable intake and cooks at home. He sleeps approximately six to seven hours per night.  He mentions a recent injury to his right fourth finger, which occurred two weeks ago when he jammed it while picking something up. The finger swelled up. He reports that he is not experiencing pain and does not have difficulty moving the finger. He notes a bump on the finger that was not present before the injury.  He also reports a past injury to his left foot about a month ago, which resulted in blood under the nail. He treated it with peroxide and alcohol, and it has since healed.  He consumes alcohol, specifically beer, about twice a week, having three to four beers each time. He denies smoking and reports no history of smoking.  He has a history of colon cancer screening in 2021 and a prostate check last year with no changes in urinary frequency or nocturia. *** He has not visited an eye doctor in over a year due to a canceled appointment and has not rescheduled. He regularly visits the dentist, with his last visit being last month.  He lives alone but has regular contact with his sister, who checks on him.  No falls in the past year.  No chest pain, difficulty breathing, or palpitations.  Immunization History  Administered Date(s) Administered   Fluad Quad(high Dose 65+)  04/28/2019, 06/14/2020, 04/13/2022   Fluad Trivalent(High Dose 65+) 04/16/2023   Influenza-Unspecified 06/26/2018, 05/01/2021   PFIZER(Purple Top)SARS-COV-2 Vaccination 08/18/2019, 09/08/2019, 12/06/2019, 05/11/2020   Pneumococcal Conjugate-13 09/05/2018   Pneumococcal Polysaccharide-23 09/28/2019   Tdap 06/22/2017, 12/01/2022   Zoster Recombinant(Shingrix) 02/08/2021, 05/01/2021    Health Maintenance  Topic Date Due   Medicare Annual Wellness (AWV)  01/12/2024   Influenza Vaccine  02/25/2024   COVID-19 Vaccine (5 - 2025-26 season) 03/27/2024   DTaP/Tdap/Td (3 - Td or Tdap) 11/30/2032   Pneumococcal Vaccine: 50+ Years  Completed   Hepatitis C Screening  Completed   Zoster Vaccines- Shingrix  Completed   HPV VACCINES  Aged Out   Meningococcal B Vaccine  Aged Out   Colonoscopy  Discontinued    Last prostate ca screening: *** Lab Results  Component Value Date   PSA1 0.6 06/22/2017   PSA 0.59 04/16/2023   PSA 0.96 09/13/2019   PSA 0.91 09/12/2018    Lab Results  Component Value Date   CHOL 126 04/19/2023   HDL 44 04/19/2023   LDLCALC 69 04/19/2023   TRIG 58 04/19/2023   CHOLHDL 2.9 04/19/2023   Lab Results  Component Value Date   NA 142 04/19/2023   CL 105 04/19/2023   K 4.3 04/19/2023   CO2 27 04/19/2023   BUN 19 04/19/2023   CREATININE 1.16 04/19/2023   EGFR 68 04/19/2023   CALCIUM  9.8 04/19/2023   ALBUMIN 4.4 04/19/2023   GLUCOSE 80 04/19/2023    Review  of Systems  Current Outpatient Medications on File Prior to Visit  Medication Sig Dispense Refill   artificial tears (LACRILUBE) OINT ophthalmic ointment Place into both eyes at bedtime. 5 g 2   aspirin 81 MG tablet Take 81 mg by mouth daily.     atorvastatin  (LIPITOR) 40 MG tablet TAKE 1 TABLET(40 MG) BY MOUTH DAILY 90 tablet 3   Bempedoic Acid-Ezetimibe  (NEXLIZET ) 180-10 MG TABS Take 1 tablet by mouth daily. STOP ezetimibe  90 tablet 3   calcipotriene (DOVONOX) 0.005 % cream APP TOPICALLY TO GROIN AND  BUTTOCKS BID     ibuprofen (ADVIL) 200 MG tablet Take 200 mg by mouth every 6 (six) hours as needed.     lidocaine  (LIDODERM ) 5 % Place 1 patch onto the skin daily as needed. Remove & Discard patch within 12 hours or as directed by MD 15 patch 0   metoprolol  succinate (TOPROL -XL) 25 MG 24 hr tablet TAKE 1 TABLET(25 MG) BY MOUTH DAILY. MAKE AN APPOINTMENT IN ORDER TO RECEIVE FUTURE REFILLS, FIRST ATTEMPT 15 tablet 0   oxyCODONE  (ROXICODONE ) 5 MG immediate release tablet Take 1 tablet (5 mg total) by mouth every 6 (six) hours as needed for severe pain (pain score 7-10). 8 tablet 0   tiZANidine  (ZANAFLEX ) 4 MG tablet TAKE 1/2 TO 1 TABLET(2 TO 4 MG) BY MOUTH AT BEDTIME 30 tablet 0   No current facility-administered medications on file prior to visit.    Past Medical History:  Diagnosis Date   Arthritis    Bell's palsy 2016   Right sided.  Not clear when acutely suffered this.  Was noted on exam after years of no medical attention 07/2014.  Evaluated by [facility]  subsequently.   Hyperlipidemia    Sickle cell trait (HCC)    Stutter age 51   cannot recall if something happened in life that set this off.   Weight loss 02/12/2017    Past Surgical History:  Procedure Laterality Date   COLONOSCOPY  10/05/2014   db 3 TAs   COLONOSCOPY  01/25/2020   hand surgery     thumb   POLYPECTOMY     TONSILLECTOMY      No Known Allergies  Family History  Problem Relation Age of Onset   Sickle cell anemia Mother    Kidney disease Father    Alcohol abuse Father    Colon cancer Neg Hx    Esophageal cancer Neg Hx    Stomach cancer Neg Hx    Rectal cancer Neg Hx    Colon polyps Neg Hx     Social History   Socioeconomic History   Marital status: Single    Spouse name: Not on file   Number of children: 0   Years of education: 12   Highest education level: 12th grade  Occupational History   Occupation: Maintenance/Housekeeping    Employer: [employer]    Comment: 2016   Tobacco Use   Smoking status: Never   Smokeless tobacco: Never  Vaping Use   Vaping status: Never Used  Substance and Sexual Activity   Alcohol use: Yes    Alcohol/week: 0.0 standard drinks of alcohol    Comment: Once a monthly   Drug use: No   Sexual activity: Yes    Partners: Female  Other Topics Concern   Not on file  Social History Narrative   Was living at a boarding house.   Patient was having difficulty affording food--reportedly because he lived in boarding  house and did not have utilitiy bills, his EBT dollars were cut to $13 to $15 per month.   That has been changed.  He now receives adequate EBT dollars.      Now living at [facility] [employer] [facility] [employer].  This is for folks with a disability.  He has an apartment now with a kitchen.  The [facility] near him set him up in these apartments that they support.     [facility] [phone]      09/05/18: Pt. Still living at [facility] [address], likes it. Sometimes participates in events hosted by center there.   Lives alone, but sister local, and main source of support.   Enjoys doing own workout routine with weights and walking. Enjoys reading.      Social Drivers of Corporate investment banker Strain: Low Risk  (01/06/2022)   Overall Financial Resource Strain (CARDIA)    Difficulty of Paying Living Expenses: Not hard at all  Food Insecurity: No Food Insecurity (01/06/2022)   Hunger Vital Sign    Worried About Running Out of Food in the Last Year: Never true    Ran Out of Food in the Last Year: Never true  Transportation Needs: No Transportation Needs (01/06/2022)   PRAPARE - Administrator, Civil Service (Medical): No    Lack of Transportation (Non-Medical): No  Physical Activity: Sufficiently Active (01/06/2022)   Exercise Vital Sign    Days of Exercise per Week: 7 days    Minutes of Exercise per Session: 30 min  Stress: No Stress Concern Present (01/06/2022)   [employer] of Occupational Health - Occupational Stress  Questionnaire    Feeling of Stress : Not at all  Social Connections: Socially Isolated (11/05/2020)   Social Connection and Isolation Panel    Frequency of Communication with Friends and Family: Once a week    Frequency of Social Gatherings with Friends and Family: Once a week    Attends Religious Services: Never    Database administrator or Organizations: No    Attends Banker Meetings: Never    Marital Status: Never married    Vitals:   04/17/24 0852  BP: 130/70  Pulse: 69  Resp: 16  Temp: 98 F (36.7 C)  SpO2: 94%   Body mass index is 23.8 kg/m.  Wt Readings from Last 3 Encounters:  04/17/24 187 lb 6.4 oz (85 kg)  02/23/24 183 lb (83 kg)  02/04/24 185 lb (83.9 kg)    Physical Exam Vitals and nursing note reviewed.  Constitutional:      General: He is not in acute distress.    Appearance: He is well-developed.  HENT:     Head: Normocephalic and atraumatic.     Right Ear: External ear normal. Drainage (clear) and swelling present. No foreign body. Tympanic membrane is not erythematous.     Left Ear: External ear normal.     Ears:     Comments: Excessive cerumen in the left ear.    Mouth/Throat:     Mouth: Mucous membranes are moist.     Pharynx: Oropharynx is clear.  Eyes:     Extraocular Movements: Extraocular movements intact.     Conjunctiva/sclera: Conjunctivae normal.     Pupils: Pupils are equal, round, and reactive to light.     Comments: Palpebral ptosis in the left eye. Epiphora right.  Cardiovascular:     Rate and Rhythm: Normal rate and regular rhythm.     Pulses:  Dorsalis pedis pulses are 2+ on the right side and 2+ on the left side.     Heart sounds: No murmur heard. Pulmonary:     Effort: Pulmonary effort is normal. No respiratory distress.     Breath sounds: Normal breath sounds.  Abdominal:     Palpations: Abdomen is soft. There is no hepatomegaly or mass.     Tenderness: There is no abdominal tenderness.   Genitourinary:    Comments: No concerns. Musculoskeletal:     Right hand: No tenderness. Normal range of motion.     Comments: No sign of synovitis. Right 4th finger with soft nodular lesion, PIP.See picture.***  Lymphadenopathy:     Cervical: No cervical adenopathy.  Skin:    General: Skin is warm.     Findings: No erythema or rash.  Neurological:     General: No focal deficit present.     Mental Status: He is alert and oriented to person, place, and time.     Sensory: No sensory deficit.     Deep Tendon Reflexes:     Reflex Scores:      Bicep reflexes are 2+ on the right side and 2+ on the left side.      Patellar reflexes are 2+ on the right side and 2+ on the left side.    Comments: Right-sided facial palsy.  No other focal deficit appreciated. Otherwise stable gait, not assisted. + Stutter.  Psychiatric:        Mood and Affect: Mood and affect normal.    ASSESSMENT AND PLAN:  Shamar was seen today for annual exam.  Diagnoses and all orders for this visit:  Routine general medical examination at a health care facility  Ganglion, finger of right hand  Hyperlipidemia, unspecified hyperlipidemia type -     Comprehensive metabolic panel with GFR; Future -     Lipid panel; Future  Prostate cancer screening -     PSA; Future  Need for influenza vaccination -     Cancel: Flu vaccine trivalent PF, 6mos and older(Flulaval,Afluria,Fluarix,Fluzone)  Other orders -     Cancel: Tdap vaccine greater than or equal to 7yo IM   Orders Placed This Encounter  Procedures   Comprehensive metabolic panel with GFR   Lipid panel   PSA    Routine general medical examination at a health care facility  Ganglion, finger of right hand  Hyperlipidemia, unspecified hyperlipidemia type -     Comprehensive metabolic panel with GFR; Future -     Lipid panel; Future  Prostate cancer screening -     PSA; Future  Need for influenza vaccination    Return in 1 year (on  04/17/2025) for CPE.  Anntonette Madewell G. [place], MD  [facility] [place] office.

## 2024-04-17 NOTE — Patient Instructions (Addendum)
 A few things to remember from today's visit:  Routine general medical examination at a health care facility  Hyperlipidemia, unspecified hyperlipidemia type - Plan: Comprehensive metabolic panel with GFR, Lipid panel  Prostate cancer screening - Plan: PSA  Need for tetanus booster - Plan: CANCELED: Tdap vaccine greater than or equal to 71yo IM  Need for influenza vaccination - Plan: CANCELED: Flu vaccine trivalent PF, 6mos and older(Flulaval,Afluria,Fluarix,Fluzone)  Ganglion, finger of right hand  Do not use My Chart to request refills or for acute issues that need immediate attention. If you send a my chart message, it may take a few days to be addressed, specially if I am not in the office.  Please be sure medication list is accurate. If a new problem present, please set up appointment sooner than planned today.  Lesion in right finger seems a ganglion.

## 2024-04-20 ENCOUNTER — Ambulatory Visit: Payer: Self-pay | Admitting: Family Medicine

## 2024-04-20 NOTE — Assessment & Plan Note (Signed)
 We discussed the importance of regular physical activity and healthy diet for prevention of chronic illness and/or complications. Preventive guidelines reviewed. Vaccination up to date, flu vaccine given today. Fall precautions discussed. Next CPE in a year.

## 2024-04-28 ENCOUNTER — Ambulatory Visit (INDEPENDENT_AMBULATORY_CARE_PROVIDER_SITE_OTHER): Admitting: Surgical

## 2024-04-28 ENCOUNTER — Other Ambulatory Visit (INDEPENDENT_AMBULATORY_CARE_PROVIDER_SITE_OTHER): Payer: Self-pay

## 2024-04-28 DIAGNOSIS — M71349 Other bursal cyst, unspecified hand: Secondary | ICD-10-CM

## 2024-04-28 DIAGNOSIS — M71341 Other bursal cyst, right hand: Secondary | ICD-10-CM

## 2024-04-28 DIAGNOSIS — M79641 Pain in right hand: Secondary | ICD-10-CM

## 2024-04-28 DIAGNOSIS — M170 Bilateral primary osteoarthritis of knee: Secondary | ICD-10-CM | POA: Diagnosis not present

## 2024-04-30 ENCOUNTER — Encounter: Payer: Self-pay | Admitting: Surgical

## 2024-04-30 MED ORDER — METHYLPREDNISOLONE ACETATE 40 MG/ML IJ SUSP
10.0000 mg | INTRAMUSCULAR | Status: AC | PRN
  Administered 2024-04-28: 10 mg

## 2024-04-30 MED ORDER — LIDOCAINE HCL 1 % IJ SOLN
3.0000 mL | INTRAMUSCULAR | Status: AC | PRN
  Administered 2024-04-28: 3 mL

## 2024-04-30 MED ORDER — BUPIVACAINE HCL 0.25 % IJ SOLN
0.2000 mL | INTRAMUSCULAR | Status: AC | PRN
  Administered 2024-04-28: .2 mL

## 2024-04-30 NOTE — Progress Notes (Signed)
 Office Visit Note   Patient: Miguel Peters            Date of Birth: Dec 23, 1950           MRN: [id number] Visit Date: 04/28/2024 Requested by: [place], Betty G, MD [address] [place],  [place] [address] PCP: [place], Betty G, MD  Subjective: Chief Complaint  Patient presents with   Left Shoulder - Follow-up   Right Hand - Pain    HPI: Miguel Peters  is a 73 y.o. male who presents to the office reporting right ring finger swelling.  He was originally here for his left shoulder but that is doing better with no issues now.  He states that he jammed his right ring finger last month and since then he has noticed a knot on the dorsal aspect of the PIP joint of the right ring finger.  He is right-hand dominant.  Not having a lot of discomfort but does not like the way that it looks.  Wants to make sure it is okay.  No history of gout that he has had.  No fevers or chills..                ROS: All systems reviewed are negative as they relate to the chief complaint within the history of present illness.  Patient denies fevers or chills.  Assessment & Plan: Visit Diagnoses:  1. Synovial cyst of hand   2. Pain in right hand   3. Primary osteoarthritis of both knees     Plan: Impression is 73 year old male who has arthritis in the right ring finger PIP joint.  He jammed his finger and since then he has had increased swelling to the dorsum of the PIP joint.  Ultrasound examination demonstrates anechoic fluid collection which seems to communicate with the PIP joint consistent with synovial cyst.  We discussed options such as observation versus aspiration versus compression.  He would like to have this aspirated.  This was aspirated of about 1-1/2 cc of gelatinous sanguinous fluid.  Very small amount of cortisone was injected after aspiration in order to help with inflammation and hopefully prevent this from returning.  He will use compression over the weekend and this was dressed with  Coban that was ensured to be somewhat snug but not too tight.  He will follow-up with us  as needed if symptoms return.  Also reports left knee arthritis and states that cortisone injections are somewhat helpful but he would like to try gel injections that he has heard are even more helpful.  We will plan to preapproved this for him and let him know when this is ready.  This patient is diagnosed with osteoarthritis of the knee(s).    Radiographs show evidence of joint space narrowing, osteophytes, subchondral sclerosis and/or subchondral cysts.  This patient has knee pain which interferes with functional and activities of daily living.    This patient has experienced inadequate response, adverse effects and/or intolerance with conservative treatments such as acetaminophen, NSAIDS, topical creams, physical therapy or regular exercise, knee bracing and/or weight loss.   This patient has experienced inadequate response or has a contraindication to intra articular steroid injections for at least 3 months.   This patient is not scheduled to have a total knee replacement within 6 months of starting treatment with viscosupplementation.   Follow-Up Instructions: No follow-ups on file.   Orders:  Orders Placed This Encounter  Procedures   XR Hand Complete Right   Ambulatory request  for injection medication   No orders of the defined types were placed in this encounter.     Procedures: Hand/UE Inj for (Synovial cyst of PIP joint of right ring finger) on 04/28/2024 10:20 AM Indications: therapeutic Details: (20) needle, dorsal approach Medications: 0.2 mL bupivacaine  0.25 %; 10 mg methylPREDNISolone  acetate 40 MG/ML; 3 mL lidocaine  1 % Outcome: tolerated well, no immediate complications Procedure, treatment alternatives, risks and benefits explained, specific risks discussed. Consent was given by the patient. Immediately prior to procedure a time out was called to verify the correct patient,  procedure, equipment, support staff and site/side marked as required. Patient was prepped and draped in the usual sterile fashion.       Clinical Data: No additional findings.  Objective: Vital Signs: There were no vitals taken for this visit.  Physical Exam:  Constitutional: Patient appears well-developed HEENT:  Head: Normocephalic Eyes:EOM are normal Neck: Normal range of motion Cardiovascular: Normal rate Pulmonary/chest: Effort normal Neurologic: Patient is alert Skin: Skin is warm Psychiatric: Patient has normal mood and affect  Ortho Exam: Ortho exam demonstrates right hand with cystic fluid collection noted focally to the dorsum of the PIP joint of the right ring finger.  He has no pain with passive motion of this joint.  There is no loss of extension or flexion strength.  He does have some stiffness at this joint but this is not new for him.  Palpable radial pulse of the right upper extremity.  Intact EPL, FPL, finger abduction.  No discoloration or evidence of cellulitis.  No streaking throughout the palm.  Specialty Comments:  No specialty comments available.  Imaging: No results found.   PMFS History: Patient Active Problem List   Diagnosis Date Noted   Sickle cell trait 04/16/2023   Routine general medical examination at a health care facility 04/16/2023   Primary osteoarthritis of both knees 06/12/2022   Right epiphora 06/12/2022   Aortic atherosclerosis 07/25/2021   Chronic pruritic rash in adult 12/28/2017   Hyperlipidemia 08/30/2017   Chronic lower back pain 08/30/2017   Stutter 01/08/2017   Right-sided Bell's palsy 09/11/2014   Bell's palsy 07/27/2014   Past Medical History:  Diagnosis Date   Arthritis    Bell's palsy 2016   Right sided.  Not clear when acutely suffered this.  Was noted on exam after years of no medical attention 07/2014.  Evaluated by [facility]  subsequently.   Hyperlipidemia    Sickle cell trait    Stutter age 77    cannot recall if something happened in life that set this off.   Weight loss 02/12/2017    Family History  Problem Relation Age of Onset   Sickle cell anemia Mother    Kidney disease Father    Alcohol abuse Father    Colon cancer Neg Hx    Esophageal cancer Neg Hx    Stomach cancer Neg Hx    Rectal cancer Neg Hx    Colon polyps Neg Hx     Past Surgical History:  Procedure Laterality Date   COLONOSCOPY  10/05/2014   db 3 TAs   COLONOSCOPY  01/25/2020   hand surgery     thumb   POLYPECTOMY     TONSILLECTOMY     Social History   Occupational History   Occupation: Maintenance/Housekeeping    Employer: [employer]    Comment: 2016  Tobacco Use   Smoking status: Never   Smokeless tobacco: Never  Vaping Use  Vaping status: Never Used  Substance and Sexual Activity   Alcohol use: Yes    Alcohol/week: 0.0 standard drinks of alcohol    Comment: Once a monthly   Drug use: No   Sexual activity: Yes    Partners: Female

## 2024-05-01 DIAGNOSIS — L819 Disorder of pigmentation, unspecified: Secondary | ICD-10-CM | POA: Diagnosis not present

## 2024-05-31 NOTE — Progress Notes (Signed)
 Cardiology Office Note:   Date:  06/08/2024  ID:  Miguel Peters , DOB 08/22/1950, MRN [id number] PCP:  Jordan, Betty G, MD  [facility] Providers Cardiologist:  Wendel Haws, MD Referring MD: Jordan, Betty G, MD  Chief Complaint/Reason for Referral: Follow-up CAD ASSESSMENT:    1. Coronary artery disease involving native coronary artery of native heart without angina pectoris   2. Hyperlipidemia LDL goal <55   3. Aortic atherosclerosis   4. CKD (chronic kidney disease) stage 2, GFR 60-89 ml/min   5. Aortic dilatation     PLAN:   In order of problems listed above: CAD: FFR negative disease by coronary CTA.  Continue aspirin 81 mg, Lipitor 40 mg, bempedoic acid/Zetia  180 x 10 daily, and Toprol  25 mg. Hyperlipidemia: Goal LDL is less than 55.  LDL in September was at 125.  Has been missing doses of bempedoic acid/Zetia .  Will refill.  I told him to take this and Lipitor every day.  Will check lipid panel and LFTs in 2 months. Aortic atherosclerosis: Continue aspirin 81 mg, Lipitor 40 mg, bempedoic acid/Zetia  180 x 10 mg CKD stage II: BP well-controlled on my repeat measure.  If elevated consider ARB in the future. Aortic dilatation: Only mildly dilated on echocardiogram 2 years ago.  Will obtain echocardiogram to evaluate further.            Dispo:  Return in about 6 months (around 12/06/2024).       I spent 35 minutes reviewing all clinical data during and prior to this visit including all relevant imaging studies, laboratories, clinical information from other health systems and prior notes from both Cardiology and other specialties, interviewing the patient, conducting a complete physical examination, and coordinating care in order to formulate a comprehensive and personalized evaluation and treatment plan.   History of Present Illness:    FOCUSED PROBLEM LIST:   CAD Mild RCA, moderate LAD with negative FFR coronary CTA 2023 Mild MR, 41 mm ascending aorta, EF 55 to  60% TTE 2023 Hyperlipidemia LP(a) 169 Aortic atherosclerosis Coronary CTA 2023 CKD stage II Aortic dilatation 41 mm, TTE 2023 BMI 18 August 2021: Seen initial consultation for chest pain and referred for coronary CTA which demonstrated mild to moderate disease with FFR negative LAD disease.  Aspirin and simvastatin  continued.   April 2023: The patient is doing very well.  He denies any exertional angina, exertional shortness of breath, presyncope, bad bleeding or bruising, palpitations, paroxysmal nocturnal dyspnea, orthopnea.  He is required no emergency room visits or hospitalizations.  He able to do everything that he needs to do in a day without any limitations.  He feels very well.  Plan: Stop Imdur .  Check lipid panel, LP(a), LFTs.  November 2025:  Patient consents to use of AI scribe. The patient returns for routine follow-up.  He was last seen last year.  At that point in time he was doing well.  His blood pressure was well-controlled.  No changes made to his medical regimen.  He has been inconsistently using Nexlizet  for cholesterol management, taking it at varying times and missing doses. He ran out of the medication but confirms having some at home now. He reports that his cholesterol was high in September, according to prior lab results.  No chest pain, breathing difficulties, or swelling in his legs. He experiences knee pain. No issues with breathing while lying flat.    Current Medications: Current Meds  Medication Sig   artificial tears (LACRILUBE) OINT  ophthalmic ointment Place into both eyes at bedtime.   aspirin 81 MG tablet Take 81 mg by mouth daily.   atorvastatin  (LIPITOR) 40 MG tablet TAKE 1 TABLET(40 MG) BY MOUTH DAILY   calcipotriene (DOVONOX) 0.005 % cream APP TOPICALLY TO GROIN AND BUTTOCKS BID   ibuprofen (ADVIL) 200 MG tablet Take 200 mg by mouth every 6 (six) hours as needed.   lidocaine  (LIDODERM ) 5 % Place 1 patch onto the skin daily as needed. Remove &  Discard patch within 12 hours or as directed by MD   metoprolol  succinate (TOPROL -XL) 25 MG 24 hr tablet TAKE 1 TABLET(25 MG) BY MOUTH DAILY. MAKE AN APPOINTMENT IN ORDER TO RECEIVE FUTURE REFILLS, FIRST ATTEMPT   oxyCODONE  (ROXICODONE ) 5 MG immediate release tablet Take 1 tablet (5 mg total) by mouth every 6 (six) hours as needed for severe pain (pain score 7-10).   tiZANidine  (ZANAFLEX ) 4 MG tablet TAKE 1/2 TO 1 TABLET(2 TO 4 MG) BY MOUTH AT BEDTIME   [DISCONTINUED] Bempedoic Acid-Ezetimibe  (NEXLIZET ) 180-10 MG TABS Take 1 tablet by mouth daily. STOP ezetimibe      Review of Systems:   Please see the history of present illness.    All other systems reviewed and are negative.     EKGs/Labs/Other Test Reviewed:   EKG: 2025 normal sinus rhythm, LVH  EKG Interpretation Date/Time:    Ventricular Rate:    PR Interval:    QRS Duration:    QT Interval:    QTC Calculation:   R Axis:      Text Interpretation:          CARDIAC STUDIES: Refer to CV Procedures and Imaging Tabs   Risk Assessment/Calculations:          Physical Exam:   VS:  BP 110/70   Pulse 67   Resp 16   Ht 6' 2 (1.88 m)   Wt 191 lb 3.2 oz (86.7 kg)   SpO2 97%   BMI 24.55 kg/m        Wt Readings from Last 3 Encounters:  06/08/24 191 lb 3.2 oz (86.7 kg)  04/17/24 187 lb 6.4 oz (85 kg)  02/23/24 183 lb (83 kg)      GENERAL:  No apparent distress, AOx3 HEENT:  No carotid bruits, +2 carotid impulses, no scleral icterus CAR: RRR no murmurs, gallops, rubs, or thrills RES:  Clear to auscultation bilaterally ABD:  Soft, nontender, nondistended, positive bowel sounds x 4 VASC:  +2 radial pulses, +2 carotid pulses NEURO:  CN 2-12 grossly intact; motor and sensory grossly intact PSYCH:  No active depression or anxiety EXT:  No edema, ecchymosis, or cyanosis  Signed, Arliss Hepburn K Mykela Mewborn, MD  06/08/2024 9:00 AM    [facility] [address] [place], [place], [place]  [address] Phone: [phone]; Fax: [phone]   Note:  This document was prepared using Dragon voice recognition software and may include unintentional dictation errors.

## 2024-06-08 ENCOUNTER — Ambulatory Visit: Attending: Internal Medicine | Admitting: Internal Medicine

## 2024-06-08 ENCOUNTER — Encounter: Payer: Self-pay | Admitting: Internal Medicine

## 2024-06-08 VITALS — BP 110/70 | HR 67 | Resp 16 | Ht 74.0 in | Wt 191.2 lb

## 2024-06-08 DIAGNOSIS — I2510 Atherosclerotic heart disease of native coronary artery without angina pectoris: Secondary | ICD-10-CM

## 2024-06-08 DIAGNOSIS — E785 Hyperlipidemia, unspecified: Secondary | ICD-10-CM | POA: Diagnosis not present

## 2024-06-08 DIAGNOSIS — I77819 Aortic ectasia, unspecified site: Secondary | ICD-10-CM

## 2024-06-08 DIAGNOSIS — N182 Chronic kidney disease, stage 2 (mild): Secondary | ICD-10-CM

## 2024-06-08 DIAGNOSIS — I700 Atherosclerosis of aorta: Secondary | ICD-10-CM | POA: Diagnosis not present

## 2024-06-08 MED ORDER — NEXLIZET 180-10 MG PO TABS: 1.0000 | ORAL_TABLET | Freq: Every day | ORAL | 3 refills | Status: AC

## 2024-06-08 NOTE — Patient Instructions (Signed)
 Medication Instructions:  Refill for Nexlizet  has been sent to your pharmacy.  *If you need a refill on your cardiac medications before your next appointment, please call your pharmacy*  Lab Work: To be completed in 2 months: lipid panel and LFT   If you have labs (blood work) drawn today and your tests are completely normal, you will receive your results only by: MyChart Message (if you have MyChart) OR A paper copy in the mail If you have any lab test that is abnormal or we need to change your treatment, we will call you to review the results.  Testing/Procedures: Your physician has requested that you have an echocardiogram. Echocardiography is a painless test that uses sound waves to create images of your heart. It provides your doctor with information about the size and shape of your heart and how well your heart's chambers and valves are working. This procedure takes approximately one hour. There are no restrictions for this procedure. Please do NOT wear cologne, perfume, aftershave, or lotions (deodorant is allowed). Please arrive 15 minutes prior to your appointment time.  Please note: We ask at that you not bring children with you during ultrasound (echo/ vascular) testing. Due to room size and safety concerns, children are not allowed in the ultrasound rooms during exams. Our front office staff cannot provide observation of children in our lobby area while testing is being conducted. An adult accompanying a patient to their appointment will only be allowed in the ultrasound room at the discretion of the ultrasound technician under special circumstances. We apologize for any inconvenience.   Follow-Up: At [facility], you and your health needs are our priority.  As part of our continuing mission to provide you with exceptional heart care, our providers are all part of one team.  This team includes your primary Cardiologist (physician) and Advanced Practice Providers or APPs  (Physician Assistants and Nurse Practitioners) who all work together to provide you with the care you need, when you need it.  Your next appointment:   6 month(s)  Provider:   One of our Advanced Practice Providers (APPs): Morse Clause, PA-C  Lamarr Satterfield, NP Miriam Shams, NP  Olivia Pavy, PA-C Josefa Beauvais, NP  Leontine Salen, PA-C Orren Fabry, PA-C  [place], PA-C Ernest Dick, NP  Damien Braver, NP Jon Hails, PA-C  Waddell Donath, PA-C    Dayna Dunn, PA-C  Scott Weaver, PA-C Lum Louis, NP Katlyn West, NP Callie Goodrich, PA-C  Xika Zhao, NP Sheng Haley, PA-C    Kathleen Johnson, PA-C

## 2024-06-28 ENCOUNTER — Ambulatory Visit: Admitting: Orthopedic Surgery

## 2024-06-28 ENCOUNTER — Other Ambulatory Visit: Payer: Self-pay

## 2024-06-28 DIAGNOSIS — M79641 Pain in right hand: Secondary | ICD-10-CM

## 2024-06-28 DIAGNOSIS — M67441 Ganglion, right hand: Secondary | ICD-10-CM | POA: Diagnosis not present

## 2024-06-28 DIAGNOSIS — M1712 Unilateral primary osteoarthritis, left knee: Secondary | ICD-10-CM

## 2024-06-30 ENCOUNTER — Encounter: Payer: Self-pay | Admitting: Orthopedic Surgery

## 2024-06-30 NOTE — Progress Notes (Unsigned)
 Office Visit Note   Patient: Miguel Peters            Date of Birth: [birth year]           MRN: [id number] Visit Date: 06/28/2024 Requested by: Jordan, Betty G, MD [address] [place],  [place] [address] PCP: Jordan, Betty G, MD  Subjective: Chief Complaint  Patient presents with   Knee Pain         HPI: Miguel Peters  is a 73 y.o. male who presents to the office reporting left knee pain and arthritis as well as right ring finger recurrent PIP cyst.  He does report left knee arthritis symptoms but does not really want to do any type of knee replacement.  Would like to try a gel injection.  He also reports a cyst on the radial aspect of the fourth finger PIP joint.  This has been aspirated in the past but it recurred.  He would like to have it aspirated again.  Not particularly painful but it is cosmetically unappealing to [facility]..                ROS: All systems reviewed are negative as they relate to the chief complaint within the history of present illness.  Patient denies fevers or chills.  Assessment & Plan: Visit Diagnoses:  1. Pain in right hand     Plan: Impression is left knee pain with arthritis.  Gel injection performed today.  We also aspirated the right finger cyst under ultrasound guidance.  Did get good decompression of the cyst.  If it recurs I think that something that he would either have to live with or consider surgical excision.  Would not really want to aspirate the cyst the third time.  Follow-up with us  as needed.  Follow-Up Instructions: No follow-ups on file.   Orders:  Orders Placed This Encounter  Procedures   US  Guided Needle Placement - No Linked Charges   No orders of the defined types were placed in this encounter.   Lot #[id number]  Procedures: Large Joint Inj: L knee on 06/28/2024 10:31 PM Indications: diagnostic evaluation, joint swelling and pain Details: 18 G 1.5 in needle, superolateral approach  Arthrogram:  No  Medications: 5 mL lidocaine  1 %; 60 mg Sodium Hyaluronate 60 MG/3ML Outcome: tolerated well, no immediate complications Procedure, treatment alternatives, risks and benefits explained, specific risks discussed. Consent was given by the patient. Immediately prior to procedure a time out was called to verify the correct patient, procedure, equipment, support staff and site/side marked as required. Patient was prepped and draped in the usual sterile fashion.    Hand/UE Inj: R ring PIP for osteoarthritis on 06/28/2024 10:32 PM Indications: therapeutic Details: 22 G needle, ultrasound-guided dorsal approach Medications: 0.33 mL bupivacaine  0.25 %; 3 mL lidocaine  1 %; 10 mg triamcinolone  acetonide 40 MG/ML Outcome: tolerated well, no immediate complications Procedure, treatment alternatives, risks and benefits explained, specific risks discussed. Consent was given by the patient. Immediately prior to procedure a time out was called to verify the correct patient, procedure, equipment, support staff and site/side marked as required. Patient was prepped and draped in the usual sterile fashion.       Clinical Data: No additional findings.  Objective: Vital Signs: There were no vitals taken for this visit.  Physical Exam:  Constitutional: Patient appears well-developed HEENT:  Head: Normocephalic Eyes:EOM are normal Neck: Normal range of motion Cardiovascular: Normal rate Pulmonary/chest: Effort normal Neurologic: Patient is alert Skin: Skin  is warm Psychiatric: Patient has normal mood and affect  Ortho Exam: Ortho exam demonstrates slight flexion contracture in the left knee with an effusion present.  Has medial greater than lateral joint line tenderness.  Pedal pulses palpable.  Ankle dorsiflexion intact.  Has flexion past 90 degrees.  No groin pain with internal/external Tatian of the leg.  Right hand demonstrates a garden pea-sized cystic structure on the radial aspect of the PIP  joint of the fourth finger.  It is cystic on ultrasound examination.  PIP motion is symmetric with the left hand and there is good collateral ligament stability in the joint with full composite range of motion of the the finger.  Specialty Comments:  No specialty comments available.  Imaging: No results found.   PMFS History: Patient Active Problem List   Diagnosis Date Noted   Sickle cell trait 04/16/2023   Routine general medical examination at a health care facility 04/16/2023   Primary osteoarthritis of both knees 06/12/2022   Right epiphora 06/12/2022   Aortic atherosclerosis 07/25/2021   Chronic pruritic rash in adult 12/28/2017   Hyperlipidemia 08/30/2017   Chronic lower back pain 08/30/2017   Stutter 01/08/2017   Right-sided Bell's palsy 09/11/2014   Bell's palsy 07/27/2014   Past Medical History:  Diagnosis Date   Arthritis    Bell's palsy 2016   Right sided.  Not clear when acutely suffered this.  Was noted on exam after years of no medical attention 07/2014.  Evaluated by [facility]  subsequently.   Hyperlipidemia    Sickle cell trait    Stutter age 70   cannot recall if something happened in life that set this off.   Weight loss 02/12/2017    Family History  Problem Relation Age of Onset   Sickle cell anemia Mother    Kidney disease Father    Alcohol abuse Father    Colon cancer Neg Hx    Esophageal cancer Neg Hx    Stomach cancer Neg Hx    Rectal cancer Neg Hx    Colon polyps Neg Hx     Past Surgical History:  Procedure Laterality Date   COLONOSCOPY  10/05/2014   db 3 TAs   COLONOSCOPY  01/25/2020   hand surgery     thumb   POLYPECTOMY     TONSILLECTOMY     Social History   Occupational History   Occupation: Maintenance/Housekeeping    Employer: [employer]    Comment: 2016  Tobacco Use   Smoking status: Never   Smokeless tobacco: Never  Vaping Use   Vaping status: Never Used  Substance and Sexual Activity   Alcohol  use: Yes    Alcohol/week: 0.0 standard drinks of alcohol    Comment: Once a monthly   Drug use: No   Sexual activity: Yes    Partners: Female

## 2024-07-01 MED ORDER — LIDOCAINE HCL 1 % IJ SOLN
5.0000 mL | INTRAMUSCULAR | Status: AC | PRN
  Administered 2024-06-28: 5 mL

## 2024-07-01 MED ORDER — BUPIVACAINE HCL 0.25 % IJ SOLN
0.3300 mL | INTRAMUSCULAR | Status: AC | PRN
  Administered 2024-06-28: .33 mL

## 2024-07-01 MED ORDER — TRIAMCINOLONE ACETONIDE 40 MG/ML IJ SUSP
10.0000 mg | INTRAMUSCULAR | Status: AC | PRN
  Administered 2024-06-28: 10 mg

## 2024-07-01 MED ORDER — LIDOCAINE HCL 1 % IJ SOLN
3.0000 mL | INTRAMUSCULAR | Status: AC | PRN
  Administered 2024-06-28: 3 mL

## 2024-07-01 MED ORDER — SODIUM HYALURONATE 60 MG/3ML IX PRSY
60.0000 mg | PREFILLED_SYRINGE | INTRA_ARTICULAR | Status: AC | PRN
  Administered 2024-06-28: 60 mg via INTRA_ARTICULAR

## 2024-07-12 ENCOUNTER — Ambulatory Visit (HOSPITAL_COMMUNITY): Admission: RE | Admit: 2024-07-12 | Discharge: 2024-07-12 | Attending: Cardiology | Admitting: Cardiology

## 2024-07-12 DIAGNOSIS — I77819 Aortic ectasia, unspecified site: Secondary | ICD-10-CM | POA: Diagnosis not present

## 2024-07-12 DIAGNOSIS — I351 Nonrheumatic aortic (valve) insufficiency: Secondary | ICD-10-CM

## 2024-07-12 DIAGNOSIS — D573 Sickle-cell trait: Secondary | ICD-10-CM | POA: Insufficient documentation

## 2024-07-12 DIAGNOSIS — I2510 Atherosclerotic heart disease of native coronary artery without angina pectoris: Secondary | ICD-10-CM | POA: Insufficient documentation

## 2024-07-12 DIAGNOSIS — I358 Other nonrheumatic aortic valve disorders: Secondary | ICD-10-CM

## 2024-07-12 DIAGNOSIS — I700 Atherosclerosis of aorta: Secondary | ICD-10-CM | POA: Diagnosis not present

## 2024-07-12 DIAGNOSIS — N189 Chronic kidney disease, unspecified: Secondary | ICD-10-CM | POA: Diagnosis not present

## 2024-07-12 DIAGNOSIS — I340 Nonrheumatic mitral (valve) insufficiency: Secondary | ICD-10-CM | POA: Diagnosis not present

## 2024-07-12 DIAGNOSIS — E785 Hyperlipidemia, unspecified: Secondary | ICD-10-CM | POA: Diagnosis not present

## 2024-07-12 DIAGNOSIS — I77810 Thoracic aortic ectasia: Secondary | ICD-10-CM | POA: Diagnosis not present

## 2024-07-12 LAB — ECHOCARDIOGRAM COMPLETE
Area-P 1/2: 2.78 cm2
S' Lateral: 2.58 cm

## 2024-08-12 ENCOUNTER — Other Ambulatory Visit: Payer: Self-pay | Admitting: Family Medicine

## 2024-08-12 ENCOUNTER — Other Ambulatory Visit: Payer: Self-pay | Admitting: Internal Medicine

## 2024-08-12 DIAGNOSIS — M542 Cervicalgia: Secondary | ICD-10-CM

## 2024-08-14 ENCOUNTER — Telehealth: Payer: Self-pay | Admitting: Internal Medicine

## 2024-08-14 NOTE — Telephone Encounter (Signed)
" °*  STAT* If patient is at the pharmacy, call can be transferred to refill team.   1. Which medications need to be refilled? (please list name of each medication and dose if known) atorvastatin  (LIPITOR) 40 MG tablet    2. Would you like to learn more about the convenience, safety, & potential cost savings by using the [facility]?   3. Are you open to using the [facility] (Type [facility]. ).   4. Which pharmacy/location (including street and city if local pharmacy) is medication to be sent to?  [id number] - [place], [place] - [address] AT [facility] [address]     5. Do they need a 30 day or 90 day supply? 90 day  "

## 2024-08-16 NOTE — Telephone Encounter (Signed)
 Pt's medication was sent to pt's pharmacy as requested. Confirmation received.

## 2024-12-04 ENCOUNTER — Ambulatory Visit: Admitting: Nurse Practitioner
